# Patient Record
Sex: Male | Born: 1955 | Race: White | Hispanic: No | Smoking: Current every day smoker
Health system: Southern US, Community
[De-identification: ages and names within clinical notes are randomized; demographics above are authoritative.]

## PROBLEM LIST (undated history)

## (undated) DIAGNOSIS — R0600 Dyspnea, unspecified: Secondary | ICD-10-CM

## (undated) DIAGNOSIS — I10 Essential (primary) hypertension: Secondary | ICD-10-CM

## (undated) DIAGNOSIS — M1990 Unspecified osteoarthritis, unspecified site: Secondary | ICD-10-CM

## (undated) DIAGNOSIS — N289 Disorder of kidney and ureter, unspecified: Secondary | ICD-10-CM

## (undated) DIAGNOSIS — G459 Transient cerebral ischemic attack, unspecified: Secondary | ICD-10-CM

## (undated) DIAGNOSIS — I4891 Unspecified atrial fibrillation: Secondary | ICD-10-CM

## (undated) DIAGNOSIS — K8689 Other specified diseases of pancreas: Secondary | ICD-10-CM

## (undated) DIAGNOSIS — I639 Cerebral infarction, unspecified: Secondary | ICD-10-CM

## (undated) DIAGNOSIS — I2699 Other pulmonary embolism without acute cor pulmonale: Secondary | ICD-10-CM

## (undated) DIAGNOSIS — I499 Cardiac arrhythmia, unspecified: Secondary | ICD-10-CM

## (undated) DIAGNOSIS — I82409 Acute embolism and thrombosis of unspecified deep veins of unspecified lower extremity: Secondary | ICD-10-CM

## (undated) DIAGNOSIS — Z87442 Personal history of urinary calculi: Secondary | ICD-10-CM

## (undated) DIAGNOSIS — Z720 Tobacco use: Secondary | ICD-10-CM

## (undated) DIAGNOSIS — K219 Gastro-esophageal reflux disease without esophagitis: Secondary | ICD-10-CM

## (undated) HISTORY — PX: VARICOSE VEIN SURGERY: SHX832

## (undated) HISTORY — PX: URETERAL STENT PLACEMENT: SHX822

## (undated) HISTORY — PX: CATARACT EXTRACTION, BILATERAL: SHX1313

---

## 1998-03-05 ENCOUNTER — Inpatient Hospital Stay (HOSPITAL_COMMUNITY): Admission: AD | Admit: 1998-03-05 | Discharge: 1998-03-08 | Payer: Self-pay | Admitting: Cardiology

## 2018-03-22 ENCOUNTER — Emergency Department (HOSPITAL_COMMUNITY): Payer: Medicare PPO

## 2018-03-22 ENCOUNTER — Other Ambulatory Visit: Payer: Self-pay

## 2018-03-22 ENCOUNTER — Emergency Department (HOSPITAL_COMMUNITY)
Admission: EM | Admit: 2018-03-22 | Discharge: 2018-03-22 | Disposition: A | Discharge status: Home / Self Care | Payer: Medicare PPO | Attending: Emergency Medicine | Admitting: Emergency Medicine

## 2018-03-22 DIAGNOSIS — K869 Disease of pancreas, unspecified: Secondary | ICD-10-CM | POA: Diagnosis not present

## 2018-03-22 DIAGNOSIS — N200 Calculus of kidney: Secondary | ICD-10-CM | POA: Diagnosis not present

## 2018-03-22 DIAGNOSIS — R109 Unspecified abdominal pain: Secondary | ICD-10-CM | POA: Diagnosis present

## 2018-03-22 LAB — BASIC METABOLIC PANEL
ANION GAP: 11 (ref 5–15)
BUN: 13 mg/dL (ref 6–20)
CALCIUM: 9.3 mg/dL (ref 8.9–10.3)
CO2: 21 mmol/L — AB (ref 22–32)
Chloride: 109 mmol/L (ref 101–111)
Creatinine, Ser: 0.92 mg/dL (ref 0.61–1.24)
GLUCOSE: 105 mg/dL — AB (ref 65–99)
POTASSIUM: 3.8 mmol/L (ref 3.5–5.1)
Sodium: 141 mmol/L (ref 135–145)

## 2018-03-22 LAB — URINALYSIS, ROUTINE W REFLEX MICROSCOPIC
GLUCOSE, UA: NEGATIVE mg/dL
Ketones, ur: NEGATIVE mg/dL
Leukocytes, UA: NEGATIVE
Nitrite: NEGATIVE
PH: 5 (ref 5.0–8.0)
PROTEIN: NEGATIVE mg/dL
Specific Gravity, Urine: 1.033 — ABNORMAL HIGH (ref 1.005–1.030)

## 2018-03-22 LAB — CBC WITH DIFFERENTIAL/PLATELET
BASOS ABS: 0 10*3/uL (ref 0.0–0.1)
BASOS PCT: 1 %
Eosinophils Absolute: 0.2 10*3/uL (ref 0.0–0.7)
Eosinophils Relative: 2 %
HEMATOCRIT: 45.8 % (ref 39.0–52.0)
HEMOGLOBIN: 15 g/dL (ref 13.0–17.0)
LYMPHS PCT: 37 %
Lymphs Abs: 3 10*3/uL (ref 0.7–4.0)
MCH: 30 pg (ref 26.0–34.0)
MCHC: 32.8 g/dL (ref 30.0–36.0)
MCV: 91.6 fL (ref 78.0–100.0)
MONO ABS: 0.8 10*3/uL (ref 0.1–1.0)
Monocytes Relative: 10 %
NEUTROS ABS: 3.9 10*3/uL (ref 1.7–7.7)
NEUTROS PCT: 50 %
PLATELETS: 173 10*3/uL (ref 150–400)
RBC: 5 MIL/uL (ref 4.22–5.81)
RDW: 13.6 % (ref 11.5–15.5)
WBC: 7.9 10*3/uL (ref 4.0–10.5)

## 2018-03-22 LAB — PROTIME-INR
INR: 1.62
Prothrombin Time: 19.1 seconds — ABNORMAL HIGH (ref 11.4–15.2)

## 2018-03-22 MED ORDER — ONDANSETRON HCL 4 MG/2ML IJ SOLN
4.0000 mg | Freq: Once | INTRAMUSCULAR | Status: AC
  Administered 2018-03-22: 4 mg via INTRAVENOUS
  Filled 2018-03-22: qty 2

## 2018-03-22 MED ORDER — HYDROMORPHONE HCL 1 MG/ML IJ SOLN
1.0000 mg | Freq: Once | INTRAMUSCULAR | Status: AC
  Administered 2018-03-22: 1 mg via INTRAVENOUS
  Filled 2018-03-22: qty 1

## 2018-03-22 MED ORDER — IBUPROFEN 800 MG PO TABS: 800.0000 mg | ORAL_TABLET | Freq: Three times a day (TID) | ORAL | 0 refills | Status: DC

## 2018-03-22 MED ORDER — TAMSULOSIN HCL 0.4 MG PO CAPS: 0.4000 mg | ORAL_CAPSULE | Freq: Every day | ORAL | 0 refills | Status: DC

## 2018-03-22 MED ORDER — SODIUM CHLORIDE 0.9 % IV BOLUS
1000.0000 mL | Freq: Once | INTRAVENOUS | Status: AC
  Administered 2018-03-22: 1000 mL via INTRAVENOUS

## 2018-03-22 MED ORDER — OXYCODONE-ACETAMINOPHEN 5-325 MG PO TABS: 1.0000 | ORAL_TABLET | ORAL | 0 refills | Status: DC | PRN

## 2018-03-22 MED ORDER — ONDANSETRON 4 MG PO TBDP: 4.0000 mg | ORAL_TABLET | Freq: Three times a day (TID) | ORAL | 0 refills | Status: DC | PRN

## 2018-03-22 MED ORDER — KETOROLAC TROMETHAMINE 30 MG/ML IJ SOLN
30.0000 mg | Freq: Once | INTRAMUSCULAR | Status: AC
  Administered 2018-03-22: 30 mg via INTRAVENOUS
  Filled 2018-03-22: qty 1

## 2018-03-22 NOTE — Discharge Instructions (Addendum)
Take the pain medication as prescribed and follow-up with the urologist.  Return to the ED if you develop worsening pain, vomiting, fever, or any other concerns.  You should see your primary physician regarding the lesion on your pancreas.

## 2018-03-22 NOTE — ED Triage Notes (Signed)
Pt complains of right sided flank pain that started yesterday around 2am. Pt reports hematuria and nausea. Pt states passing 2-3 stones in urine, has hx of stones.

## 2018-03-22 NOTE — ED Provider Notes (Signed)
[facility] EMERGENCY DEPARTMENT Provider Note   CSN: [id number] Arrival date & time: 03/22/18  4098     History   Chief Complaint Chief Complaint  Patient presents with  . Flank Pain    HPI Roger Phelps is a 62 y.o. male.  Obese male with history of kidney stones presenting with right-sided flank and abdominal pain that woke him from sleep around 2 AM yesterday.  He states he had some hematuria and nausea and believes he passed some kidney stones yesterday morning.  He did start to feel better the pain returned again tonight.  He reports pain to his right abdomen right side and right low back similar to previous kidney stones.  He denies any fever or vomiting.  He has had some nausea.  He has some pain with urination and hematuria.  Denies any testicular pain.  Reports he is required a catheter in the past as well as had lithotripsy for kidney stones.  He does not take anything at home for pain. Patient on coumadin for history of PE/DVT.  The history is provided by the patient.  Flank Pain  Pertinent negatives include no chest pain, no abdominal pain, no headaches and no shortness of breath.    No past medical history on file.  There are no active problems to display for this patient.   * The histories are not reviewed yet. Please review them in the "History" navigator section and refresh this SmartLink.      Home Medications    Prior to Admission medications   Not on File    Family History No family history on file.  Social History Social History   Tobacco Use  . Smoking status: Not on file  Substance Use Topics  . Alcohol use: Not on file  . Drug use: Not on file     Allergies   Patient has no allergy information on record.   Review of Systems Review of Systems  Constitutional: Negative for activity change, appetite change and fever.  HENT: Negative for congestion and rhinorrhea.   Eyes: Negative for visual disturbance.  Respiratory: Negative for  cough, chest tightness and shortness of breath.   Cardiovascular: Negative for chest pain.  Gastrointestinal: Positive for nausea. Negative for abdominal pain and vomiting.  Genitourinary: Positive for decreased urine volume, difficulty urinating, dysuria, flank pain, hematuria and urgency. Negative for penile swelling, scrotal swelling and testicular pain.  Musculoskeletal: Positive for back pain. Negative for arthralgias and myalgias.  Skin: Negative for rash.  Neurological: Negative for dizziness, weakness and headaches.    all other systems are negative except as noted in the HPI and PMH.    Physical Exam Updated Vital Signs BP (!) 157/78 (BP Location: Left Arm)   Pulse 93   Temp 98 F (36.7 C) (Oral)   Resp (!) 22   Ht  (2.007 m)   Wt (!) 172.4 kg (380 lb)   SpO2 98%   BMI 42.81 kg/m   Physical Exam  Constitutional: He is oriented to person, place, and time. He appears well-developed and well-nourished. No distress.  HENT:  Head: Normocephalic and atraumatic.  Mouth/Throat: Oropharynx is clear and moist. No oropharyngeal exudate.  Eyes: Pupils are equal, round, and reactive to light. Conjunctivae and EOM are normal.  Neck: Normal range of motion. Neck supple.  No meningismus.  Cardiovascular: Normal rate, regular rhythm, normal heart sounds and intact distal pulses.  No murmur heard. Pulmonary/Chest: Effort normal and breath sounds normal. No  respiratory distress.  Abdominal: Soft. There is tenderness. There is no rebound and no guarding.  Obese, right-sided abdominal tenderness without guarding or rebound  Genitourinary:  Genitourinary Comments: No testicular tenderness  Musculoskeletal: Normal range of motion. He exhibits tenderness. He exhibits no edema.  Right CVA tenderness  Neurological: He is alert and oriented to person, place, and time. No cranial nerve deficit. He exhibits normal muscle tone. Coordination normal.  No ataxia on finger to nose bilaterally.  No pronator drift. 5/5 strength throughout. CN 2-12 intact.Equal grip strength. Sensation intact.   Skin: Skin is warm.  Psychiatric: He has a normal mood and affect. His behavior is normal.  Nursing note and vitals reviewed.    ED Treatments / Results  Labs (all labs ordered are listed, but only abnormal results are displayed) Labs Reviewed  URINALYSIS, ROUTINE W REFLEX MICROSCOPIC - Abnormal; Notable for the following components:      Result Value   Color, Urine AMBER (*)    APPearance CLOUDY (*)    Specific Gravity, Urine 1.033 (*)    Hgb urine dipstick SMALL (*)    Bilirubin Urine SMALL (*)    Bacteria, UA RARE (*)    All other components within normal limits  BASIC METABOLIC PANEL - Abnormal; Notable for the following components:   CO2 21 (*)    Glucose, Bld 105 (*)    All other components within normal limits  PROTIME-INR - Abnormal; Notable for the following components:   Prothrombin Time 19.1 (*)    All other components within normal limits  CBC WITH DIFFERENTIAL/PLATELET    EKG None  Radiology Ct Renal Stone Study  Result Date: 03/22/2018 CLINICAL DATA:  62 year old male with right flank pain. Hematuria and nausea. EXAM: CT ABDOMEN AND PELVIS WITHOUT CONTRAST TECHNIQUE: Multidetector CT imaging of the abdomen and pelvis was performed following the standard protocol without IV contrast. COMPARISON:  None. FINDINGS: Evaluation of this exam is limited in the absence of intravenous contrast. Lower chest: There is a partially calcified 6 mm nodule at the right lung base, most likely a partially calcified granuloma. The visualized lung bases are otherwise clear. No intra-abdominal free air or free fluid. Hepatobiliary: No focal liver abnormality is seen. No gallstones, gallbladder wall thickening, or biliary dilatation. Pancreas: The pancreas is unremarkable. There is a 4 x 4 cm fatty lesion in the uncinate process of the pancreas which may represent a lipoma. MRI may provide  better characterisation. There is no dilatation of the main pancreatic duct or gland atrophy. Spleen: Normal in size without focal abnormality. Adrenals/Urinary Tract: The adrenal glands are unremarkable. There is a 5 mm stone at the right ureteropelvic junction with mild right hydronephrosis. Mildly lobulated appearance of the left renal cortex. There is no hydronephrosis or nephrolithiasis on the left. The urinary bladder is only partially distended and appears unremarkable. Stomach/Bowel: There is no bowel obstruction or active inflammation. The appendix is not visualized with certainty. No inflammatory changes identified in the right lower quadrant. Vascular/Lymphatic: Mild atherosclerotic calcification of the distal aorta and iliac vessels. The abdominal aorta and IVC are otherwise grossly unremarkable on this noncontrast CT. No portal venous gas. There is no adenopathy. Reproductive: The prostate and seminal vesicles are grossly unremarkable. Other: None Musculoskeletal: Degenerative changes of the lower lumbar spine. No acute osseous pathology. IMPRESSION: 1. A 5 mm right UPJ stone with mild right hydronephrosis. 2. 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 Nonemergent MRI may  provide better characterisation. Electronically Signed   By: Elgie Collard M.D.   On: 03/22/2018 02:24    Procedures Procedures (including critical care time)  Medications Ordered in ED Medications  sodium chloride 0.9 % bolus 1,000 mL (1,000 mLs Intravenous New Bag/Given 03/22/18 0131)  ondansetron (ZOFRAN) injection 4 mg (has no administration in time range)  HYDROmorphone (DILAUDID) injection 1 mg (1 mg Intravenous Given 03/22/18 0124)  ketorolac (TORADOL) 30 MG/ML injection 30 mg (30 mg Intravenous Given 03/22/18 0124)     Initial Impression / Assessment and Plan / ED Course  I have reviewed the triage vital signs and the nursing notes.  Pertinent labs & imaging results that were available  during my care of the patient were reviewed by me and considered in my medical decision making (see chart for details).    Patient with history of kidney stones presenting with right-sided abdominal and flank pain with difficulty with urination.  Bladder scan 91 cc.  Urinalysis shows hematuria without infection.  Patient is hemodynamic is stable and afebrile.  CT scan confirms a 5 mm right UPJ stone.  Patient informed of pancreas lipoma and need for follow-up. Also informed of subtherapeutic INR.  Pain and nausea controlled in the ED.  Follow-up with urology.  No recent narcotic prescriptions in the database.  Return precautions discussed including fever, vomiting or worsening pain.   Final Clinical Impressions(s) / ED Diagnoses   Final diagnoses:  Kidney stone  Lesion of pancreas    ED Discharge Orders    None       Ailyne Pawley, Jeannett Senior, MD 03/22/18 [phone]

## 2018-07-27 DIAGNOSIS — R0789 Other chest pain: Secondary | ICD-10-CM | POA: Diagnosis not present

## 2018-07-27 DIAGNOSIS — K869 Disease of pancreas, unspecified: Secondary | ICD-10-CM | POA: Insufficient documentation

## 2018-07-27 DIAGNOSIS — I482 Chronic atrial fibrillation: Secondary | ICD-10-CM | POA: Insufficient documentation

## 2018-07-27 DIAGNOSIS — G8194 Hemiplegia, unspecified affecting left nondominant side: Secondary | ICD-10-CM | POA: Diagnosis not present

## 2018-07-27 DIAGNOSIS — F17200 Nicotine dependence, unspecified, uncomplicated: Secondary | ICD-10-CM | POA: Diagnosis not present

## 2018-07-27 DIAGNOSIS — Z86718 Personal history of other venous thrombosis and embolism: Secondary | ICD-10-CM | POA: Insufficient documentation

## 2018-07-27 DIAGNOSIS — Z801 Family history of malignant neoplasm of trachea, bronchus and lung: Secondary | ICD-10-CM | POA: Diagnosis not present

## 2018-07-27 DIAGNOSIS — R51 Headache: Secondary | ICD-10-CM | POA: Diagnosis not present

## 2018-07-27 DIAGNOSIS — Z7982 Long term (current) use of aspirin: Secondary | ICD-10-CM | POA: Insufficient documentation

## 2018-07-27 DIAGNOSIS — Z79899 Other long term (current) drug therapy: Secondary | ICD-10-CM | POA: Diagnosis not present

## 2018-07-27 DIAGNOSIS — Z7901 Long term (current) use of anticoagulants: Secondary | ICD-10-CM | POA: Insufficient documentation

## 2018-07-27 DIAGNOSIS — Z87442 Personal history of urinary calculi: Secondary | ICD-10-CM | POA: Diagnosis not present

## 2018-07-27 DIAGNOSIS — Z9841 Cataract extraction status, right eye: Secondary | ICD-10-CM | POA: Insufficient documentation

## 2018-07-27 DIAGNOSIS — Z86711 Personal history of pulmonary embolism: Secondary | ICD-10-CM | POA: Diagnosis not present

## 2018-07-27 DIAGNOSIS — Z885 Allergy status to narcotic agent status: Secondary | ICD-10-CM | POA: Diagnosis not present

## 2018-07-27 DIAGNOSIS — R29818 Other symptoms and signs involving the nervous system: Secondary | ICD-10-CM | POA: Diagnosis present

## 2018-07-27 DIAGNOSIS — Z83511 Family history of glaucoma: Secondary | ICD-10-CM | POA: Diagnosis not present

## 2018-07-27 DIAGNOSIS — I340 Nonrheumatic mitral (valve) insufficiency: Secondary | ICD-10-CM | POA: Diagnosis not present

## 2018-07-27 DIAGNOSIS — R29810 Facial weakness: Secondary | ICD-10-CM | POA: Insufficient documentation

## 2018-07-27 DIAGNOSIS — I679 Cerebrovascular disease, unspecified: Secondary | ICD-10-CM | POA: Insufficient documentation

## 2018-07-27 DIAGNOSIS — R4781 Slurred speech: Secondary | ICD-10-CM | POA: Diagnosis not present

## 2018-07-27 DIAGNOSIS — Z9842 Cataract extraction status, left eye: Secondary | ICD-10-CM | POA: Diagnosis not present

## 2018-07-27 DIAGNOSIS — I371 Nonrheumatic pulmonary valve insufficiency: Secondary | ICD-10-CM | POA: Diagnosis not present

## 2018-07-28 ENCOUNTER — Emergency Department (HOSPITAL_COMMUNITY): Payer: Medicare PPO

## 2018-07-28 ENCOUNTER — Observation Stay (HOSPITAL_COMMUNITY): Payer: Medicare PPO

## 2018-07-28 ENCOUNTER — Observation Stay (HOSPITAL_COMMUNITY)
Admission: EM | Admit: 2018-07-28 | Discharge: 2018-07-29 | Disposition: A | Discharge status: Home / Self Care | Payer: Medicare PPO | Attending: Internal Medicine | Admitting: Internal Medicine

## 2018-07-28 ENCOUNTER — Other Ambulatory Visit: Payer: Self-pay

## 2018-07-28 ENCOUNTER — Observation Stay (HOSPITAL_BASED_OUTPATIENT_CLINIC_OR_DEPARTMENT_OTHER): Payer: Medicare PPO

## 2018-07-28 ENCOUNTER — Encounter (HOSPITAL_COMMUNITY): Payer: Self-pay | Admitting: *Deleted

## 2018-07-28 DIAGNOSIS — I4891 Unspecified atrial fibrillation: Secondary | ICD-10-CM | POA: Diagnosis present

## 2018-07-28 DIAGNOSIS — Z86718 Personal history of other venous thrombosis and embolism: Secondary | ICD-10-CM

## 2018-07-28 DIAGNOSIS — R079 Chest pain, unspecified: Secondary | ICD-10-CM

## 2018-07-28 DIAGNOSIS — I340 Nonrheumatic mitral (valve) insufficiency: Secondary | ICD-10-CM | POA: Diagnosis not present

## 2018-07-28 DIAGNOSIS — R29818 Other symptoms and signs involving the nervous system: Secondary | ICD-10-CM | POA: Diagnosis present

## 2018-07-28 DIAGNOSIS — G459 Transient cerebral ischemic attack, unspecified: Secondary | ICD-10-CM

## 2018-07-28 DIAGNOSIS — Z7901 Long term (current) use of anticoagulants: Secondary | ICD-10-CM

## 2018-07-28 DIAGNOSIS — I639 Cerebral infarction, unspecified: Secondary | ICD-10-CM

## 2018-07-28 DIAGNOSIS — Z86711 Personal history of pulmonary embolism: Secondary | ICD-10-CM | POA: Diagnosis present

## 2018-07-28 DIAGNOSIS — Z720 Tobacco use: Secondary | ICD-10-CM | POA: Diagnosis present

## 2018-07-28 DIAGNOSIS — I482 Chronic atrial fibrillation, unspecified: Secondary | ICD-10-CM

## 2018-07-28 DIAGNOSIS — K8689 Other specified diseases of pancreas: Secondary | ICD-10-CM | POA: Diagnosis present

## 2018-07-28 HISTORY — DX: Disorder of kidney and ureter, unspecified: N28.9

## 2018-07-28 HISTORY — DX: Other specified diseases of pancreas: K86.89

## 2018-07-28 HISTORY — DX: Tobacco use: Z72.0

## 2018-07-28 HISTORY — DX: Unspecified atrial fibrillation: I48.91

## 2018-07-28 HISTORY — DX: Acute embolism and thrombosis of unspecified deep veins of unspecified lower extremity: I82.409

## 2018-07-28 HISTORY — DX: Other pulmonary embolism without acute cor pulmonale: I26.99

## 2018-07-28 LAB — RAPID URINE DRUG SCREEN, HOSP PERFORMED
AMPHETAMINES: NOT DETECTED
BENZODIAZEPINES: NOT DETECTED
Barbiturates: NOT DETECTED
COCAINE: NOT DETECTED
Opiates: NOT DETECTED
Tetrahydrocannabinol: NOT DETECTED

## 2018-07-28 LAB — CBC
HCT: 49.1 % (ref 39.0–52.0)
HEMOGLOBIN: 16.6 g/dL (ref 13.0–17.0)
MCH: 30.8 pg (ref 26.0–34.0)
MCHC: 33.8 g/dL (ref 30.0–36.0)
MCV: 91.1 fL (ref 78.0–100.0)
Platelets: 175 10*3/uL (ref 150–400)
RBC: 5.39 MIL/uL (ref 4.22–5.81)
RDW: 13.7 % (ref 11.5–15.5)
WBC: 9.6 10*3/uL (ref 4.0–10.5)

## 2018-07-28 LAB — BASIC METABOLIC PANEL
ANION GAP: 10 (ref 5–15)
BUN: 11 mg/dL (ref 6–20)
CALCIUM: 9 mg/dL (ref 8.9–10.3)
CO2: 23 mmol/L (ref 22–32)
Chloride: 106 mmol/L (ref 98–111)
Creatinine, Ser: 0.85 mg/dL (ref 0.61–1.24)
Glucose, Bld: 117 mg/dL — ABNORMAL HIGH (ref 70–99)
POTASSIUM: 3.6 mmol/L (ref 3.5–5.1)
Sodium: 139 mmol/L (ref 135–145)

## 2018-07-28 LAB — DIFFERENTIAL
BASOS ABS: 0 10*3/uL (ref 0.0–0.1)
BASOS PCT: 0 %
Eosinophils Absolute: 0.3 10*3/uL (ref 0.0–0.7)
Eosinophils Relative: 3 %
Lymphocytes Relative: 35 %
Lymphs Abs: 3.2 10*3/uL (ref 0.7–4.0)
Monocytes Absolute: 0.8 10*3/uL (ref 0.1–1.0)
Monocytes Relative: 9 %
NEUTROS ABS: 4.8 10*3/uL (ref 1.7–7.7)
NEUTROS PCT: 53 %

## 2018-07-28 LAB — TROPONIN I

## 2018-07-28 LAB — URINALYSIS, ROUTINE W REFLEX MICROSCOPIC
Bilirubin Urine: NEGATIVE
Glucose, UA: NEGATIVE mg/dL
Hgb urine dipstick: NEGATIVE
Ketones, ur: NEGATIVE mg/dL
Leukocytes, UA: NEGATIVE
Nitrite: NEGATIVE
PH: 5 (ref 5.0–8.0)
Protein, ur: NEGATIVE mg/dL
SPECIFIC GRAVITY, URINE: 1.008 (ref 1.005–1.030)

## 2018-07-28 LAB — HEPATIC FUNCTION PANEL
ALBUMIN: 3.8 g/dL (ref 3.5–5.0)
ALK PHOS: 77 U/L (ref 38–126)
ALT: 28 U/L (ref 0–44)
AST: 28 U/L (ref 15–41)
BILIRUBIN INDIRECT: 0.6 mg/dL (ref 0.3–0.9)
Bilirubin, Direct: 0.1 mg/dL (ref 0.0–0.2)
TOTAL PROTEIN: 7.5 g/dL (ref 6.5–8.1)
Total Bilirubin: 0.7 mg/dL (ref 0.3–1.2)

## 2018-07-28 LAB — ECHOCARDIOGRAM COMPLETE
Height: 79 in
WEIGHTICAEL: 5950.66 [oz_av]

## 2018-07-28 LAB — LIPID PANEL
Cholesterol: 121 mg/dL (ref 0–200)
HDL: 22 mg/dL — AB (ref >40)
LDL Cholesterol: 83 mg/dL (ref 0–99)
TRIGLYCERIDES: 80 mg/dL (ref <150)
Total CHOL/HDL Ratio: 5.5 RATIO
VLDL: 16 mg/dL (ref 0–40)

## 2018-07-28 LAB — PROTIME-INR
INR: 1.78
INR: 1.82
PROTHROMBIN TIME: 20.5 s — AB (ref 11.4–15.2)
PROTHROMBIN TIME: 20.9 s — AB (ref 11.4–15.2)

## 2018-07-28 LAB — ETHANOL: Alcohol, Ethyl (B): <10 mg/dL (ref <10)

## 2018-07-28 LAB — I-STAT TROPONIN, ED: Troponin i, poc: 0 ng/mL (ref 0.00–0.08)

## 2018-07-28 LAB — MRSA PCR SCREENING: MRSA by PCR: NEGATIVE

## 2018-07-28 MED ORDER — NITROGLYCERIN 0.4 MG SL SUBL
0.4000 mg | SUBLINGUAL_TABLET | SUBLINGUAL | Status: DC | PRN
  Administered 2018-07-28: 0.4 mg via SUBLINGUAL
  Filled 2018-07-28: qty 1

## 2018-07-28 MED ORDER — ACETAMINOPHEN 325 MG PO TABS: 650.0000 mg | ORAL_TABLET | ORAL | Status: DC | PRN

## 2018-07-28 MED ORDER — NICOTINE 21 MG/24HR TD PT24
21.0000 mg | MEDICATED_PATCH | Freq: Every day | TRANSDERMAL | Status: DC | PRN
  Administered 2018-07-29: 21 mg via TRANSDERMAL
  Filled 2018-07-28: qty 1

## 2018-07-28 MED ORDER — ASPIRIN 81 MG PO CHEW
324.0000 mg | CHEWABLE_TABLET | Freq: Once | ORAL | Status: DC
  Filled 2018-07-28: qty 4

## 2018-07-28 MED ORDER — STROKE: EARLY STAGES OF RECOVERY BOOK
Freq: Once | Status: AC
  Administered 2018-07-28: 11:00:00
  Filled 2018-07-28 (×2): qty 1

## 2018-07-28 MED ORDER — ACETAMINOPHEN 160 MG/5ML PO SOLN: 650.0000 mg | ORAL | Status: DC | PRN

## 2018-07-28 MED ORDER — WARFARIN SODIUM 2.5 MG PO TABS
2.5000 mg | ORAL_TABLET | Freq: Once | ORAL | Status: AC
  Administered 2018-07-28: 2.5 mg via ORAL
  Filled 2018-07-28: qty 1

## 2018-07-28 MED ORDER — ONDANSETRON HCL 4 MG PO TABS: 4.0000 mg | ORAL_TABLET | Freq: Four times a day (QID) | ORAL | Status: DC | PRN

## 2018-07-28 MED ORDER — WARFARIN SODIUM 5 MG PO TABS
10.0000 mg | ORAL_TABLET | Freq: Once | ORAL | Status: AC
  Administered 2018-07-28: 10 mg via ORAL
  Filled 2018-07-28: qty 2

## 2018-07-28 MED ORDER — ACETAMINOPHEN 650 MG RE SUPP: 650.0000 mg | RECTAL | Status: DC | PRN

## 2018-07-28 MED ORDER — ONDANSETRON HCL 4 MG/2ML IJ SOLN: 4.0000 mg | Freq: Four times a day (QID) | INTRAMUSCULAR | Status: DC | PRN

## 2018-07-28 MED ORDER — WARFARIN - PHARMACIST DOSING INPATIENT
Freq: Every day | Status: DC
  Administered 2018-07-28 – 2018-07-29 (×2)

## 2018-07-28 NOTE — ED Provider Notes (Signed)
[facility] EMERGENCY DEPARTMENT Provider Note   CSN: [id number] Arrival date & time: 07/27/18  2359     History   Chief Complaint Chief Complaint  Patient presents with  . Chest Pain    HPI Roger Phelps is a 62 y.o. male.  The history is provided by the patient.  He has history of atrial fibrillation, DVT, pulmonary embolism and is anticoagulated on warfarin, comes in because of chest pain since yesterday afternoon.  He describes a heavy feeling in his chest which he rates at 8/10.  Nothing makes it better, nothing makes it worse.  There is associated dyspnea, but no nausea or diaphoresis.  He is also complaining of some pain in his left upper arm which she also rates at 8/10.  Nothing makes the pain better, nothing makes it worse.  He has not done anything to treat these symptoms.  He has also noted that his speech has been slurred with the last known time when he was not slurring his speech 2 days ago.  He denies any weakness or numbness.  Of note, last INR was about 1 week ago and he thinks it was 2.8.  Past Medical History:  Diagnosis Date  . Atrial fibrillation (HCC)   . DVT (deep venous thrombosis) (HCC)   . Pancreatic mass   . Pulmonary embolism (HCC)   . Renal disorder    kidney stones    There are no active problems to display for this patient.   Past Surgical History:  Procedure Laterality Date  . CATARACT EXTRACTION, BILATERAL    . URETERAL STENT PLACEMENT Bilateral   . VARICOSE VEIN SURGERY Left         Home Medications    Prior to Admission medications   Medication Sig Start Date End Date Taking? Authorizing Provider  ibuprofen (ADVIL,MOTRIN) 800 MG tablet Take 1 tablet (800 mg total) by mouth 3 (three) times daily. 03/22/18   Rancour, Jeannett Senior, MD  ondansetron (ZOFRAN ODT) 4 MG disintegrating tablet Take 1 tablet (4 mg total) by mouth every 8 (eight) hours as needed for nausea or vomiting. 03/22/18   Rancour, Jeannett Senior, MD  oxyCODONE-acetaminophen  (PERCOCET/ROXICET) 5-325 MG tablet Take 1 tablet by mouth every 4 (four) hours as needed for severe pain. 03/22/18   Rancour, Jeannett Senior, MD  tamsulosin (FLOMAX) 0.4 MG CAPS capsule Take 1 capsule (0.4 mg total) by mouth daily after supper. 03/22/18   Glynn Octave, MD    Family History History reviewed. No pertinent family history.  Social History Social History   Tobacco Use  . Smoking status: Current Some Day Smoker  . Smokeless tobacco: Never Used  Substance Use Topics  . Alcohol use: Yes    Comment: ocassionally  . Drug use: Never     Allergies   Codeine   Review of Systems Review of Systems  All other systems reviewed and are negative.    Physical Exam Updated Vital Signs BP 109/75 (BP Location: Left Arm)   Pulse 89   Temp 98 F (36.7 C) (Oral)   Resp 17   Ht 6\' 7"  (2.007 m)   Wt (!) 170.1 kg   SpO2 94%   BMI 42.25 kg/m   Physical Exam  Nursing note and vitals reviewed.  62 year old male, resting comfortably and in no acute distress. Vital signs are normal. Oxygen saturation is 94%, which is normal. Head is normocephalic and atraumatic. PERRLA, EOMI. Oropharynx is clear. Neck is nontender and supple without adenopathy or JVD.  There are no carotid bruits. Back is nontender and there is no CVA tenderness. Lungs are clear without rales, wheezes, or rhonchi. Chest is nontender. Heart has an irregular rhythm without murmur. Abdomen is soft, flat, nontender without masses or hepatosplenomegaly and peristalsis is normoactive. Extremities have 2+ edema, full range of motion is present. Skin is warm and dry without rash. Neurologic: Awake and alert and oriented.  Speech appears normal to me.  Mild right central facial droop.  Tongue protrudes in the midline.  Moderate to severe left sided weakness with left arm strength 3/5 and left leg strength 4/5.  There is some spasticity in the left arm.  No Babinski response noted.  Strength on the right side is 5/5.  ED  Treatments / Results  Labs (all labs ordered are listed, but only abnormal results are displayed) Labs Reviewed  BASIC METABOLIC PANEL - Abnormal; Notable for the following components:      Result Value   Glucose, Bld 117 (*)    All other components within normal limits  PROTIME-INR - Abnormal; Notable for the following components:   Prothrombin Time 20.9 (*)    All other components within normal limits  CBC  DIFFERENTIAL  ETHANOL  RAPID URINE DRUG SCREEN, HOSP PERFORMED  URINALYSIS, ROUTINE W REFLEX MICROSCOPIC  HEPATIC FUNCTION PANEL  I-STAT TROPONIN, ED    EKG EKG Interpretation  Date/Time:  Monday July 28 2018 00:05:26 EDT Ventricular Rate:  83 PR Interval:    QRS Duration: 93 QT Interval:  370 QTC Calculation: 435 R Axis:   61 Text Interpretation:  Atrial fibrillation Minimal ST depression, diffuse leads No old tracing to compare Confirmed by Dione Booze ([id number]) on 07/28/2018 12:09:02 AM   Radiology Dg Chest 2 View  Result Date: 07/28/2018 CLINICAL DATA:  Chest pain onset today while sitting in a chair, headache and slurred speech since 1400 hours on Saturday EXAM: CHEST - 2 VIEW COMPARISON:  None FINDINGS: Normal heart size, mediastinal contours, and pulmonary vascularity. Lungs clear. No acute infiltrate, pleural effusion or pneumothorax. Bones appear demineralized. IMPRESSION: No acute abnormalities. Electronically Signed   By: Ulyses Southward M.D.   On: 07/28/2018 01:39   Ct Head Wo Contrast  Result Date: 07/28/2018 CLINICAL DATA:  Headache with slurred speech for 36 hours. EXAM: CT HEAD WITHOUT CONTRAST TECHNIQUE: Contiguous axial images were obtained from the base of the skull through the vertex without intravenous contrast. COMPARISON:  None. FINDINGS: Brain: No intracranial hemorrhage, mass effect, or midline shift. No hydrocephalus. The basilar cisterns are patent. No evidence of territorial infarct or acute ischemia. No extra-axial or intracranial fluid  collection. Vascular: No hyperdense vessel. Skull: No fracture or focal lesion. Sinuses/Orbits: No acute findings.  Bilateral cataract resection. Other: None. IMPRESSION: No acute intracranial abnormality. Electronically Signed   By: Narda Rutherford M.D.   On: 07/28/2018 01:52    Procedures Procedures  CRITICAL CARE Performed by: Dione Booze Total critical care time: 40 minutes Critical care time was exclusive of separately billable procedures and treating other patients. Critical care was necessary to treat or prevent imminent or life-threatening deterioration. Critical care was time spent personally by me on the following activities: development of treatment plan with patient and/or surrogate as well as nursing, discussions with consultants, evaluation of patient's response to treatment, examination of patient, obtaining history from patient or surrogate, ordering and performing treatments and interventions, ordering and review of laboratory studies, ordering and review of radiographic studies, pulse oximetry and re-evaluation of patient's condition.  Medications Ordered in ED Medications  aspirin chewable tablet 324 mg (324 mg Oral Not Given 07/28/18 0245)  nitroGLYCERIN (NITROSTAT) SL tablet 0.4 mg (0.4 mg Sublingual Given 07/28/18 0221)     Initial Impression / Assessment and Plan / ED Course  I have reviewed the triage vital signs and the nursing notes.  Pertinent labs & imaging results that were available during my care of the patient were reviewed by me and considered in my medical decision making (see chart for details).  Chest pain of uncertain cause.  Constant nature of pain would be atypical for ACS.  ECG shows minimal ST segment changes that do not appear particularly ischemic.  Initial troponin is normal and, given length of time symptoms have been present, makes ACS very unlikely. Left-sided weakness appears to be a subacute stroke.  Right-sided facial weakness with left-sided  extremity weakness is suggestive of a posterior circulation stroke.  Anticoagulated state.  Aspirin had been administered by EMS.  He is well outside the window for thrombolytic treatment or endovascular treatment, so code stroke is not activated.  He is sent for CT of the head, will need to check INR.  Old records are reviewed, and he has no relevant past visits.  CT of head is negative for acute process.  He continues to have chest discomfort, and will be given nitroglycerin.  INR is slightly subtherapeutic.  CBC and metabolic panel are unremarkable.  Chest x-ray is unremarkable.  He will need to have MRI done in the morning.  Case is discussed with Dr. Robb Matar, of [facility] hospitalist who agrees to admit the patient.  CHA2DS2/VAS Stroke Risk score = 2, based on stroke today.   Final Clinical Impressions(s) / ED Diagnoses   Final diagnoses:  Cerebrovascular accident (CVA), unspecified mechanism (HCC)  Nonspecific chest pain  Chronic atrial fibrillation (HCC)  Anticoagulated on warfarin    ED Discharge Orders    None       Dione Booze, MD 07/28/18 [phone]

## 2018-07-28 NOTE — Progress Notes (Signed)
ANTICOAGULATION CONSULT NOTE -   Pharmacy Consult for Warfarin  Indication: Atrial fibrillation  Allergies  Allergen Reactions  . Codeine Nausea Only    Patient Measurements: Height: 6\' 7"  (200.7 cm) Weight: (!) 371 lb 14.7 oz (168.7 kg) IBW/kg (Calculated) : 93.7 HEPARIN DW (KG): 132.6   Vital Signs: Temp: 97.4 F (36.3 C) (09/09 1140) Temp Source: Oral (09/09 1140) BP: 126/89 (09/09 0900) Pulse Rate: 69 (09/09 0900)  Labs: Recent Labs    07/28/18 0015 07/28/18 0537 07/28/18 1235 07/28/18 1353  HGB 16.6  --   --   --   HCT 49.1  --   --   --   PLT 175  --   --   --   LABPROT 20.9*  --   --  20.5*  INR 1.82  --   --  1.78  CREATININE 0.85  --   --   --   TROPONINI  --  <0.03 <0.03  --    Estimated Creatinine Clearance: 161.7 mL/min (by C-G formula based on SCr of 0.85 mg/dL).  Medical History: Past Medical History:  Diagnosis Date  . Atrial fibrillation (HCC)   . DVT (deep venous thrombosis) (HCC)   . Pancreatic mass   . Pulmonary embolism (HCC)   . Renal disorder    kidney stones  . Tobacco use     Medications:  Warfarin 10 mg PO daily. Last dose per patient at 1700 07/27/18  Assessment: 62 yo male with hx of chronic Afib, PE, and DVT seen in the ED for chest tightness radiating to his left arm.Pt also reports frontal headache x 24 hrs with slurred speech and observed right facial droop and left-sided extremity weakness. Pharmacy has been consulted for warfarin dosing.  Patient reports his last INR was 2.8 within the last week on his usual 10 mg daily dose. Currently is INR is slightly subtherapeutic at 1.78  Goal of Therapy:  Therapeutic INR: 2-3  Plan:  Warfarin 10 mg PO x 1 dose Daily PT/INR    Judeth Cornfield, PharmD Clinical Pharmacist 07/28/2018 2:38 PM

## 2018-07-28 NOTE — Plan of Care (Signed)
  Problem: Acute Rehab PT Goals(only PT should resolve) Goal: Patient Will Transfer Sit To/From Stand Outcome: Progressing Flowsheets (Taken 07/28/2018 1220) Patient will transfer sit to/from stand: with modified independence Goal: Pt Will Transfer Bed To Chair/Chair To Bed Outcome: Progressing Flowsheets (Taken 07/28/2018 1220) Pt will Transfer Bed to Chair/Chair to Bed: with modified independence Goal: Pt Will Ambulate Outcome: Progressing Flowsheets (Taken 07/28/2018 1220) Pt will Ambulate: with modified independence; 75 feet; with cane   12:21 PM, 07/28/18 Ocie Bob, MPT Physical Therapist with [facility] [phone] office [phone] mobile phone

## 2018-07-28 NOTE — Progress Notes (Signed)
ANTICOAGULATION CONSULT NOTE - Preliminary  Pharmacy Consult for Warfarin  Indication: Atrial fibrillation  Allergies  Allergen Reactions  . Codeine Nausea Only    Patient Measurements: Height: 6\' 7"  (200.7 cm) Weight: (!) 375 lb (170.1 kg) IBW/kg (Calculated) : 93.7 HEPARIN DW (KG): 133   Vital Signs: Temp: 98 F (36.7 C) (09/09 0048) Temp Source: Oral (09/09 0009) BP: 122/91 (09/09 0330) Pulse Rate: 94 (09/09 0330)  Labs: Recent Labs    07/28/18 0015  HGB 16.6  HCT 49.1  PLT 175  LABPROT 20.9*  INR 1.82  CREATININE 0.85   Estimated Creatinine Clearance: 162.5 mL/min (by C-G formula based on SCr of 0.85 mg/dL).  Medical History: Past Medical History:  Diagnosis Date  . Atrial fibrillation (HCC)   . DVT (deep venous thrombosis) (HCC)   . Pancreatic mass   . Pulmonary embolism (HCC)   . Renal disorder    kidney stones  . Tobacco use     Medications:  Warfarin 10 mg PO daily. Last dose per patient at 1700 07/27/18  Assessment: 62 yo male with hx of chronic Afib, PE, and DVT seen in the ED for chest tightness radiating to his left arm.Pt also reports frontal headache x 24 hrs with slurred speech and observed right facial droop and left-sided extremity weakness. Pharmacy has been consulted for warfarin dosing.  Patient reports his last INR was 2.8 within the last week on his usual 10 mg daily dose. Currently is INR is slightly subtherapeutic at 1.82.  Goal of Therapy:  Therapeutic INR: 2-3  Plan:  Warfarin 2.5 mg PO x 1 dose Daily PT/INR  Preliminary review of pertinent patient information completed.  Jeani Hawking clinical pharmacist will complete review during morning rounds to assess the patient and finalize treatment regimen.  Arelia Sneddon, [facility] 07/28/2018,4:57 AM

## 2018-07-28 NOTE — Care Management Obs Status (Signed)
MEDICARE OBSERVATION STATUS NOTIFICATION   Patient Details  Name: Roger Phelps MRN: [id number] Date of Birth: [birth year]   Medicare Observation Status Notification Given:  Yes    Dequavion Follette, Chrystine Oiler, RN 07/28/2018, 4:16 PM

## 2018-07-28 NOTE — Evaluation (Signed)
Physical Therapy Evaluation Patient Details Name: Roger Phelps MRN: [id number] DOB: [birth year] Today's Date: 07/28/2018   History of Present Illness  Roger Phelps is a 62 y.o. male with medical history significant of chronic atrial fibrillation, history of PE, history of DVT, pancreatic mass, history of urolithiasis, tobacco use who is coming to the emergency department due to chest tightness that started Sunday evening while he was seeding in his chair which radiates to his left arm, associated with mild dyspnea, but denies nausea, emesis, diaphoresis or palpitations.  There are no relieving or worsening factors.  He was given aspirin and sublingual nitroglycerin in route to the hospital without any significant changes to the pain.  He also complains of having a frontal headache since Saturday evening associated with slurred speech and he has been noticed to have right facial droop and left-sided extremities weakness in the emergency department.  He was last known to be well on Saturday.  He has had some dizziness and gait disturbance while ambulating.  He denies fever, chills, sore throat, wheezing, hemoptysis, diarrhea, constipation, melena or hematochezia.  No dysuria, frequency or hematuria.  Denies polyuria, polydipsia, polyphagia or blurred vision.  Denies pruritus or skin rashes.    Clinical Impression  Patient states he normally has weakness LLE secondary to hip problems and slightly weaker in LUE, but worse than usual.  Patient demonstrates labored movement for sitting up at bedside, able to stand, transfer to chair without problem using armrest of chair, tends to lean on nearby objects for support when walking in hallway, no loss of balance, but limited due to mild SOB and fatigue.  Patient tolerated sitting up in chair after therapy.  Patient will benefit from continued physical therapy in hospital and recommended venue below to increase strength, balance, endurance for safe ADLs and gait.     Follow Up Recommendations Home health PT;Outpatient PT(HHPT or OP)    Equipment Recommendations  None recommended by PT    Recommendations for Other Services       Precautions / Restrictions Precautions Precautions: Fall Restrictions Weight Bearing Restrictions: No      Mobility  Bed Mobility Overal bed mobility: Modified Independent             General bed mobility comments: increased time with head of bed flat no use of bed rails  Transfers Overall transfer level: Needs assistance Equipment used: None;1 person hand held assist Transfers: Sit to/from Stand;Stand Pivot Transfers Sit to Stand: Supervision Stand pivot transfers: Supervision       General transfer comment: slow labored movement  Ambulation/Gait Ambulation/Gait assistance: Min guard Gait Distance (Feet): 35 Feet Assistive device: None Gait Pattern/deviations: Decreased step length - right;Decreased step length - left;Decreased stride length Gait velocity: decreased   General Gait Details: slightly labored cadence with frequent leaning on nearby objects for support, no loss of balance, limited mostly due to c/o fatigue  Stairs            Wheelchair Mobility    Modified Rankin (Stroke Patients Only)       Balance Overall balance assessment: Mild deficits observed, not formally tested                                           Pertinent Vitals/Pain Pain Assessment: No/denies pain    Home Living Family/patient expects to be discharged to:: Private residence Living Arrangements:  Alone Available Help at Discharge: Neighbor;Family Type of Home: House Home Access: Ramped entrance     Home Layout: One level Home Equipment: Cane - single point;Walker - 2 wheels;Wheelchair - manual;Shower seat;Bedside commode      Prior Function Level of Independence: Independent with assistive device(s)         Comments: community ambulator with single point cane PRN, drives      Hand Dominance   Dominant Hand: Right    Extremity/Trunk Assessment   Upper Extremity Assessment Upper Extremity Assessment: Defer to OT evaluation    Lower Extremity Assessment Lower Extremity Assessment: Generalized weakness;RLE deficits/detail;LLE deficits/detail RLE Deficits / Details: grossly 5/5 LLE Deficits / Details: grossly -4/5    Cervical / Trunk Assessment Cervical / Trunk Assessment: Normal  Communication   Communication: No difficulties  Cognition Arousal/Alertness: Awake/alert Behavior During Therapy: WFL for tasks assessed/performed Overall Cognitive Status: Within Functional Limits for tasks assessed                                        General Comments      Exercises     Assessment/Plan    PT Assessment Patient needs continued PT services  PT Problem List Decreased strength;Decreased activity tolerance;Decreased balance;Decreased mobility       PT Treatment Interventions Gait training;Stair training;Functional mobility training;Therapeutic activities;Therapeutic exercise;Patient/family education    PT Goals (Current goals can be found in the Care Plan section)  Acute Rehab PT Goals Patient Stated Goal: return home PT Goal Formulation: With patient Time For Goal Achievement: 08/01/18 Potential to Achieve Goals: Good    Frequency 7X/week   Barriers to discharge        Co-evaluation               AM-PAC PT "6 Clicks" Daily Activity  Outcome Measure Difficulty turning over in bed (including adjusting bedclothes, sheets and blankets)?: None Difficulty moving from lying on back to sitting on the side of the bed? : None Difficulty sitting down on and standing up from a chair with arms (e.g., wheelchair, bedside commode, etc,.)?: None Help needed moving to and from a bed to chair (including a wheelchair)?: A Little Help needed walking in hospital room?: A Little Help needed climbing 3-5 steps with a railing? : A  Little 6 Click Score: 21    End of Session Equipment Utilized During Treatment: Gait belt Activity Tolerance: Patient tolerated treatment well;Patient limited by fatigue Patient left: in chair;with call bell/phone within reach Nurse Communication: Mobility status;Other (comment)(RN notified that patient left up in chair) PT Visit Diagnosis: Unsteadiness on feet (R26.81);Other abnormalities of gait and mobility (R26.89);Muscle weakness (generalized) (M62.81)    Time: 5621-3086 PT Time Calculation (min) (ACUTE ONLY): 31 min   Charges:   PT Evaluation $PT Eval Moderate Complexity: 1 Mod PT Treatments $Therapeutic Activity: 23-37 mins        12:19 PM, 07/28/18 Ocie Bob, MPT Physical Therapist with [facility] 336 [phone] office [phone] mobile phone

## 2018-07-28 NOTE — Progress Notes (Signed)
Confirmed with Vella Kohler at [facility] that coumadin 2.5mg  was supposed to be given as scheduled for 5am.

## 2018-07-28 NOTE — ED Triage Notes (Signed)
Pt brought in by [facility] for c/o chest pain that started today while sitting in his chair; pt has been c/o headache with slurred speech since 2pm Saturday; pt cbg with ems 106; pt given 324 asa and one nitro SL en route by ems

## 2018-07-28 NOTE — Consult Note (Signed)
[place] A. Merlene Laughter, MD     [email or web address]          Roger Phelps is an 62 y.o. male.   ASSESSMENT/PLAN: 1.  Multiple neurological symptoms of unclear etiology: Imaging failed to reveal acute ischemic stroke which is typically expected in the situation.  Additional differential diagnosis includes complex partial seizures and complicated migraine/migraine with aura.  An EEG will be obtained.  The patient is to continue with the warfarin long-term.  Aspirin can be discontinued after 1 week.    The patient is a 62 year old white male who presents with multiple symptoms but primarily he tells me that he had a severe headache about 2 days ago while sitting watching television.  The headaches were bifrontal and came on 10/10 at that time.  The headache lasted for several hours and was not associated with other symptoms at that time.  However, the following day he reports people telling him that his speech has been slurry throughout the day.  The patient also reports having some weakness involving the left side.  His symptoms have improved since being hospitalized but he still is not back to normal.  He does not report a baseline history of episodic headaches.  There is no history of seizures.  He does not think that he had loss of consciousness or alteration of consciousness.  He reports having some chest tightness but no dyspnea.  The review of systems is otherwise unrevealing.    GENERAL: This is an obese male in no acute distress.  HEENT: Neck is supple no trauma appreciated appreciated.  ABDOMEN: soft  EXTREMITIES: There is mild ankle edema.  There is mild reddening at the ankles/distal legs.  BACK: Normal  SKIN: Normal by inspection.    MENTAL STATUS: Alert and oriented. Speech -revealed subtle dysarthria but this could be baseline, language and cognition are generally intact. Judgment and insight normal.   CRANIAL NERVES: Pupils are equal, round and  reactive to light and accomodation; extra ocular movements are full, there is no significant nystagmus; visual fields are full; upper and lower facial muscles are normal in strength and symmetric, there is no flattening of the nasolabial folds; tongue is midline; uvula is midline; shoulder elevation is normal.  MOTOR: There is mild left upper extremity drift and also mild drift of the legs bilaterally.  Direct strength testing is normal however.  Bulk and tone are normal throughout.    COORDINATION: Left finger to nose is normal, right finger to nose is normal, No rest tremor; no intention tremor; no postural tremor; no bradykinesia.  REFLEXES: Deep tendon reflexes are symmetrical and normal.   SENSATION: Normal to light touch, temperature, and pain.   NIH stroke scale 1, 1, 1, 1 total equal 4.    The brain MRI and MRA are reviewed in person.  It appears to scan are both limited due to motion artifact.  There may be mild white matter periventricular ischemic changes.  Nothing seen on DWI.  MRA is also limited but no clear large vessel occlusive disease.  Blood pressure 123/63, pulse 69, temperature 98 F (36.7 C), temperature source Oral, resp. rate 15, height '6\' 7"'$  (2.007 m), weight (!) 168.7 kg, SpO2 97 %.  Past Medical History:  Diagnosis Date  . Atrial fibrillation ([place])   . DVT (deep venous thrombosis) ([place])   . Pancreatic mass   . Pulmonary embolism ([place])   . Renal disorder    kidney stones  . Tobacco use  Past Surgical History:  Procedure Laterality Date  . CATARACT EXTRACTION, BILATERAL    . URETERAL STENT PLACEMENT Bilateral   . VARICOSE VEIN SURGERY Left     Family History  Problem Relation Age of Onset  . Glaucoma Mother   . Lung cancer Father     Social History:  reports that he has been smoking. He has never used smokeless tobacco. He reports that he drinks alcohol. He reports that he does not use drugs.  Allergies:  Allergies  Allergen Reactions  .  Codeine Nausea Only    Medications: Prior to Admission medications   Medication Sig Start Date End Date Taking? Authorizing Provider  b complex vitamins capsule Take 1 capsule by mouth daily.   Yes [provider]  Cholecalciferol (VITAMIN D-3) 5000 units TABS Take 10,000 Units by mouth daily.   Yes [provider]  Omega-3 Fatty Acids (FISH OIL) 1200 MG CAPS Take 2 capsules by mouth daily.   Yes [provider]  warfarin (COUMADIN) 10 MG tablet Take 10 mg by mouth daily.   Yes [provider]  ibuprofen (ADVIL,MOTRIN) 800 MG tablet Take 1 tablet (800 mg total) by mouth 3 (three) times daily. Patient not taking: Reported on 07/28/2018 03/22/18   Ezequiel Essex, MD  ondansetron (ZOFRAN ODT) 4 MG disintegrating tablet Take 1 tablet (4 mg total) by mouth every 8 (eight) hours as needed for nausea or vomiting. Patient not taking: Reported on 07/28/2018 03/22/18   Ezequiel Essex, MD  oxyCODONE-acetaminophen (PERCOCET/ROXICET) 5-325 MG tablet Take 1 tablet by mouth every 4 (four) hours as needed for severe pain. Patient not taking: Reported on 07/28/2018 03/22/18   Ezequiel Essex, MD  tamsulosin (FLOMAX) 0.4 MG CAPS capsule Take 1 capsule (0.4 mg total) by mouth daily after supper. Patient not taking: Reported on 07/28/2018 03/22/18   Ezequiel Essex, MD    Scheduled Meds: . aspirin  324 mg Oral Once  . warfarin  10 mg Oral Once  . Warfarin - Pharmacist Dosing Inpatient   Does not apply q1800   Continuous Infusions: PRN Meds:.acetaminophen **OR** acetaminophen (TYLENOL) oral liquid 160 mg/5 mL **OR** acetaminophen, nicotine, nitroGLYCERIN, ondansetron **OR** ondansetron (ZOFRAN) IV     Results for orders placed or performed during the hospital encounter of 07/28/18 (from the past 48 hour(s))  Basic metabolic panel     Status: Abnormal   Collection Time: 07/28/18 12:15 AM  Result Value Ref Range   Sodium 139 135 - 145 mmol/L   Potassium 3.6 3.5 - 5.1 mmol/L    Chloride 106 98 - 111 mmol/L   CO2 23 22 - 32 mmol/L   Glucose, Bld 117 (H) 70 - 99 mg/dL   BUN 11 6 - 20 mg/dL   Creatinine, Ser 0.85 0.61 - 1.24 mg/dL   Calcium 9.0 8.9 - 10.3 mg/dL   GFR calc non Af Amer >60 >60 mL/min   GFR calc Af Amer >60 >60 mL/min    Comment: (NOTE) The eGFR has been calculated using the CKD EPI equation. This calculation has not been validated in all clinical situations. eGFR's persistently <60 mL/min signify possible Chronic Kidney Disease.    Anion gap 10 5 - 15    Comment: Performed at [facility], [address], [place], [place] [address]  CBC     Status: None   Collection Time: 07/28/18 12:15 AM  Result Value Ref Range   WBC 9.6 4.0 - 10.5 K/uL   RBC 5.39 4.22 - 5.81 MIL/uL  Hemoglobin 16.6 13.0 - 17.0 g/dL   HCT 49.1 39.0 - 52.0 %   MCV 91.1 78.0 - 100.0 fL   MCH 30.8 26.0 - 34.0 pg   MCHC 33.8 30.0 - 36.0 g/dL   RDW 13.7 11.5 - 15.5 %   Platelets 175 150 - 400 K/uL    Comment: Performed at [facility], [address], [place], [place] [address]  Differential     Status: None   Collection Time: 07/28/18 12:15 AM  Result Value Ref Range   Neutrophils Relative % 53 %   Neutro Abs 4.8 1.7 - 7.7 K/uL   Lymphocytes Relative 35 %   Lymphs Abs 3.2 0.7 - 4.0 K/uL   Monocytes Relative 9 %   Monocytes Absolute 0.8 0.1 - 1.0 K/uL   Eosinophils Relative 3 %   Eosinophils Absolute 0.3 0.0 - 0.7 K/uL   Basophils Relative 0 %   Basophils Absolute 0.0 0.0 - 0.1 K/uL    Comment: Performed at [facility], [address], [place], [place] [address]  Protime-INR     Status: Abnormal   Collection Time: 07/28/18 12:15 AM  Result Value Ref Range   Prothrombin Time 20.9 (H) 11.4 - 15.2 seconds   INR 1.82     Comment: Performed at [facility], [address], [place], [place] [address]  Ethanol     Status: None   Collection Time: 07/28/18 12:15 AM  Result Value Ref Range   Alcohol, Ethyl (B) <10 <10 mg/dL    Comment: Performed at [facility], [address], [place], [place] [address]  Hepatic function panel     Status: None   Collection Time: 07/28/18 12:15 AM  Result Value Ref Range   Total Protein 7.5 6.5 - 8.1 g/dL   Albumin 3.8 3.5 - 5.0 g/dL   AST 28 15 - 41 U/L   ALT 28 0 - 44 U/L   Alkaline Phosphatase 77 38 - 126 U/L   Total Bilirubin 0.7 0.3 - 1.2 mg/dL   Bilirubin, Direct 0.1 0.0 - 0.2 mg/dL   Indirect Bilirubin 0.6 0.3 - 0.9 mg/dL    Comment: Performed at [facility], [address] [place], [place] [address]  I-stat troponin, ED     Status: None   Collection Time: 07/28/18 12:22 AM  Result Value Ref Range   Troponin i, poc 0.00 0.00 - 0.08 ng/mL   Comment 3            Comment: Due to the release kinetics of cTnI, a negative result within the first hours of the onset of symptoms does not rule out myocardial infarction with certainty. If myocardial infarction is still suspected, repeat the test at appropriate intervals.   Urinalysis, Routine w reflex microscopic     Status: None   Collection Time: 07/28/18 12:50 AM  Result Value Ref Range   Color, Urine YELLOW YELLOW   APPearance CLEAR CLEAR   Specific Gravity, Urine 1.008 1.005 - 1.030   pH 5.0 5.0 - 8.0   Glucose, UA NEGATIVE NEGATIVE mg/dL   Hgb urine dipstick NEGATIVE NEGATIVE   Bilirubin Urine NEGATIVE NEGATIVE   Ketones, ur NEGATIVE NEGATIVE mg/dL   Protein, ur NEGATIVE NEGATIVE mg/dL   Nitrite NEGATIVE NEGATIVE   Leukocytes, UA NEGATIVE NEGATIVE    Comment: Performed at [facility], [address], [place], [place] [address]  Urine rapid drug screen (hosp performed)     Status: None   Collection Time: 07/28/18 12:53 AM  Result Value Ref Range   Opiates NONE DETECTED NONE DETECTED   Cocaine NONE DETECTED NONE DETECTED   Benzodiazepines NONE DETECTED NONE DETECTED   Amphetamines NONE DETECTED NONE DETECTED   Tetrahydrocannabinol NONE DETECTED NONE DETECTED   Barbiturates NONE DETECTED NONE DETECTED    Comment: (NOTE) DRUG SCREEN FOR MEDICAL  PURPOSES ONLY.  IF CONFIRMATION IS NEEDED FOR ANY PURPOSE, NOTIFY LAB WITHIN 5 DAYS. LOWEST DETECTABLE LIMITS FOR URINE DRUG SCREEN Drug Class                     Cutoff (ng/mL) Amphetamine and metabolites    1000 Barbiturate and metabolites    200 Benzodiazepine                 557 Tricyclics and metabolites     300 Opiates and metabolites        300 Cocaine and metabolites        300 THC                            50 Performed at [facility], [address] [place], [place] [address]   MRSA PCR Screening     Status: None   Collection Time: 07/28/18  4:01 AM  Result Value Ref Range   MRSA by PCR NEGATIVE NEGATIVE    Comment:        The GeneXpert MRSA Assay (FDA approved for NASAL specimens only), is one component of a comprehensive MRSA colonization surveillance program. It is not intended to diagnose MRSA infection nor to guide or monitor treatment for MRSA infections. Performed at [facility], [address], [place], [place] [address]   Lipid panel     Status: Abnormal   Collection Time: 07/28/18  5:37 AM  Result Value Ref Range   Cholesterol 121 0 - 200 mg/dL   Triglycerides 80 <150 mg/dL   HDL 22 (L) >40 mg/dL   Total CHOL/HDL Ratio 5.5 RATIO   VLDL 16 0 - 40 mg/dL   LDL Cholesterol 83 0 - 99 mg/dL    Comment:        Total Cholesterol/HDL:CHD Risk Coronary Heart Disease Risk Table                     Men   Women  1/2 Average Risk   3.4   3.3  Average Risk       5.0   4.4  2 X Average Risk   9.6   7.1  3 X Average Risk  23.4   11.0        Use the calculated Patient Ratio above and the CHD Risk Table to determine the patient's CHD Risk.        ATP III CLASSIFICATION (LDL):  <100     mg/dL   Optimal  100-129  mg/dL   Near or Above                    Optimal  130-159  mg/dL   Borderline  160-189  mg/dL   High  >190     mg/dL   Very High Performed at [facility], [address], [place], [place] [address]   Troponin I-serum (one time only)     Status:  None   Collection Time: 07/28/18  5:37 AM  Result Value Ref Range   Troponin I <0.03 <0.03 ng/mL    Comment: Performed at [facility], [address], [place], [place] [address]  Troponin I-serum (one time only)     Status: None   Collection Time: 07/28/18 12:35 PM  Result Value Ref Range   Troponin I <0.03 <0.03 ng/mL    Comment: Performed at [facility], [address] [place], [place] [address]  Protime-INR     Status: Abnormal   Collection Time: 07/28/18  1:53 PM  Result Value Ref Range   Prothrombin Time 20.5 (H) 11.4 - 15.2 seconds   INR 1.78     Comment: Performed at [facility], [address], [place], [place] [address]    Studies/Results:   BRAIN MRI MRA FINDINGS: MRI HEAD FINDINGS  Brain: Diffusion imaging does not show any acute or subacute infarction. The brainstem and cerebellum are normal. Cerebral hemispheres appear normal without evidence of atrophy or old small or large vessel infarction. No mass lesion, hemorrhage, hydrocephalus or extra-axial collection. Patient would not allow complete imaging.  Vascular: Major vessels at the base of the brain show flow.  Skull and upper cervical spine: Negative  Sinuses/Orbits: Clear/normal  Other: None  MRA HEAD FINDINGS  Motion degraded exam. Both internal carotid arteries are patent through the skull base. The anterior and middle cerebral vessels show flow. Both vertebral arteries show flow to the basilar. There is flow in the basilar. Both posterior cerebral arteries show flow.  IMPRESSION: Motion degraded and abbreviated exam. No acute intracranial finding. No sign of old infarction.  Motion degraded intracranial MR angiography shows flow in the major vessels.      Amiir Heckard A. Merlene Laughter, M.D.  Diplomate, Tax adviser of Psychiatry and Neurology ( Neurology). 07/28/2018, 5:34 PM

## 2018-07-28 NOTE — H&P (Addendum)
History and Physical    Roger Phelps VWP:[id number] DOB: 1956-06-23 DOA: 07/28/2018  PCP: Dorice Lamas, MD   Patient coming from: Home.  I have personally briefly reviewed patient's old medical records in [facility]  Chief Complaint: CP, HA and slurred speech.  HPI: Roger Phelps is a 62 y.o. male with medical history significant of chronic atrial fibrillation, history of PE, history of DVT, pancreatic mass, history of urolithiasis, tobacco use who is coming to the emergency department due to chest tightness that started Sunday evening while he was seeding in his chair which radiates to his left arm, associated with mild dyspnea, but denies nausea, emesis, diaphoresis or palpitations.  There are no relieving or worsening factors.  He was given aspirin and sublingual nitroglycerin in route to the hospital without any significant changes to the pain.    He also complains of having a frontal headache since Saturday evening associated with slurred speech and he has been noticed to have right facial droop and left-sided extremities weakness in the emergency department.  He was last known to be well on Saturday.  He has had some dizziness and gait disturbance while ambulating.  He denies fever, chills, sore throat, wheezing, hemoptysis, diarrhea, constipation, melena or hematochezia.  No dysuria, frequency or hematuria.  Denies polyuria, polydipsia, polyphagia or blurred vision.  Denies pruritus or skin rashes.  ED Course: Initial vital signs temperature 98 F, pulse 89, respirations 17, blood pressure 109/75 mmHg and O2 sat 94% on room air.  His urinalysis and UDS were negative.  Alcohol level was less than 10 mg/dL.  CBC was normal.  PT was 20.9 seconds and INR 1.82.  BMP showed a glucose level of 117 mg/dL, but all other values were normal.  Hepatic function panel was normal as well.  Troponin was normal.  EKG shows atrial fibrillation with questionable minimal ST depression.  A 2 view  chest radiograph did not show any acute cardiopulmonary pathology.  CT head without contrast did not have any acute intracranial abnormality.  Review of Systems: As per HPI otherwise 10 point review of systems negative.   Past Medical History:  Diagnosis Date  . Atrial fibrillation (HCC)   . DVT (deep venous thrombosis) (HCC)   . Pancreatic mass   . Pulmonary embolism (HCC)   . Renal disorder    kidney stones  . Tobacco use     Past Surgical History:  Procedure Laterality Date  . CATARACT EXTRACTION, BILATERAL    . URETERAL STENT PLACEMENT Bilateral   . VARICOSE VEIN SURGERY Left      reports that he has been smoking. He has never used smokeless tobacco. He reports that he drinks alcohol. He reports that he does not use drugs.  Allergies  Allergen Reactions  . Codeine Nausea Only    Family History  Problem Relation Age of Onset  . Glaucoma Mother   . Lung cancer Father     Prior to Admission medications   Medication Sig Start Date End Date Taking? Authorizing Provider  Cholecalciferol (VITAMIN D-3) 5000 units TABS Take 10,000 Units by mouth daily.   Yes [provider]  warfarin (COUMADIN) 10 MG tablet Take 10 mg by mouth daily.   Yes [provider]  ibuprofen (ADVIL,MOTRIN) 800 MG tablet Take 1 tablet (800 mg total) by mouth 3 (three) times daily. 03/22/18   Rancour, Jeannett Senior, MD  ondansetron (ZOFRAN ODT) 4 MG disintegrating tablet Take 1 tablet (4 mg total) by mouth  every 8 (eight) hours as needed for nausea or vomiting. 03/22/18   Rancour, Jeannett Senior, MD  oxyCODONE-acetaminophen (PERCOCET/ROXICET) 5-325 MG tablet Take 1 tablet by mouth every 4 (four) hours as needed for severe pain. 03/22/18   Rancour, Jeannett Senior, MD  tamsulosin (FLOMAX) 0.4 MG CAPS capsule Take 1 capsule (0.4 mg total) by mouth daily after supper. 03/22/18   Glynn Octave, MD    Physical Exam: Vitals:   07/28/18 0245 07/28/18 0300 07/28/18 0315 07/28/18 0330  BP: 121/80 98/67 124/77 (!)  122/91  Pulse: 82 65 63 94  Resp: 19 (!) 21 13 17   Temp:      TempSrc:      SpO2: 93% 95% 95% 96%  Weight:      Height:        Constitutional: NAD, calm, comfortable Eyes: PERRL, right eyelid and conjunctivae are injected ENMT: Mucous membranes are moist. Posterior pharynx clear of any exudate or lesions. Neck: normal, supple, no masses, no thyromegaly Respiratory: clear to auscultation bilaterally, no wheezing, no crackles. Normal respiratory effort. No accessory muscle use.  Cardiovascular: Regular rate and rhythm, no murmurs / rubs / gallops. No extremity edema. 2+ pedal pulses. No carotid bruits.  Abdomen: Obese, soft, no tenderness, no masses palpated. No hepatosplenomegaly. Bowel sounds positive.  Musculoskeletal: no clubbing / cyanosis. Good ROM, no contractures. Normal muscle tone.  Skin: no rashes, lesions, ulcers on limited dermatological examination. Neurologic: Right facial droop, other CN 2-12 grossly intact. Sensation intact, DTR normal. 3/5 LUE and 4/5 LLE weakness. Impaired coordination of RUE. Psychiatric: Normal judgment and insight. Alert and oriented x 3. Normal mood.   Labs on Admission: I have personally reviewed following labs and imaging studies  CBC: Recent Labs  Lab 07/28/18 0015  WBC 9.6  NEUTROABS 4.8  HGB 16.6  HCT 49.1  MCV 91.1  PLT 175   Basic Metabolic Panel: Recent Labs  Lab 07/28/18 0015  NA 139  K 3.6  CL 106  CO2 23  GLUCOSE 117*  BUN 11  CREATININE 0.85  CALCIUM 9.0   GFR: Estimated Creatinine Clearance: 162.5 mL/min (by C-G formula based on SCr of 0.85 mg/dL). Liver Function Tests: Recent Labs  Lab 07/28/18 0015  AST 28  ALT 28  ALKPHOS 77  BILITOT 0.7  PROT 7.5  ALBUMIN 3.8   No results for input(s): LIPASE, AMYLASE in the last 168 hours. No results for input(s): AMMONIA in the last 168 hours. Coagulation Profile: Recent Labs  Lab 07/28/18 0015  INR 1.82   Cardiac Enzymes: No results for input(s): CKTOTAL,  CKMB, CKMBINDEX, TROPONINI in the last 168 hours. BNP (last 3 results) No results for input(s): PROBNP in the last 8760 hours. HbA1C: No results for input(s): HGBA1C in the last 72 hours. CBG: No results for input(s): GLUCAP in the last 168 hours. Lipid Profile: No results for input(s): CHOL, HDL, LDLCALC, TRIG, CHOLHDL, LDLDIRECT in the last 72 hours. Thyroid Function Tests: No results for input(s): TSH, T4TOTAL, FREET4, T3FREE, THYROIDAB in the last 72 hours. Anemia Panel: No results for input(s): VITAMINB12, FOLATE, FERRITIN, TIBC, IRON, RETICCTPCT in the last 72 hours. Urine analysis:    Component Value Date/Time   COLORURINE YELLOW 07/28/2018 0050   APPEARANCEUR CLEAR 07/28/2018 0050   LABSPEC 1.008 07/28/2018 0050   PHURINE 5.0 07/28/2018 0050   GLUCOSEU NEGATIVE 07/28/2018 0050   HGBUR NEGATIVE 07/28/2018 0050   BILIRUBINUR NEGATIVE 07/28/2018 0050   KETONESUR NEGATIVE 07/28/2018 0050   PROTEINUR NEGATIVE 07/28/2018 0050   NITRITE  NEGATIVE 07/28/2018 0050   LEUKOCYTESUR NEGATIVE 07/28/2018 0050    Radiological Exams on Admission: Dg Chest 2 View  Result Date: 07/28/2018 CLINICAL DATA:  Chest pain onset today while sitting in a chair, headache and slurred speech since 1400 hours on Saturday EXAM: CHEST - 2 VIEW COMPARISON:  None FINDINGS: Normal heart size, mediastinal contours, and pulmonary vascularity. Lungs clear. No acute infiltrate, pleural effusion or pneumothorax. Bones appear demineralized. IMPRESSION: No acute abnormalities. Electronically Signed   By: Ulyses Southward M.D.   On: 07/28/2018 01:39   Ct Head Wo Contrast  Result Date: 07/28/2018 CLINICAL DATA:  Headache with slurred speech for 36 hours. EXAM: CT HEAD WITHOUT CONTRAST TECHNIQUE: Contiguous axial images were obtained from the base of the skull through the vertex without intravenous contrast. COMPARISON:  None. FINDINGS: Brain: No intracranial hemorrhage, mass effect, or midline shift. No hydrocephalus. The  basilar cisterns are patent. No evidence of territorial infarct or acute ischemia. No extra-axial or intracranial fluid collection. Vascular: No hyperdense vessel. Skull: No fracture or focal lesion. Sinuses/Orbits: No acute findings.  Bilateral cataract resection. Other: None. IMPRESSION: No acute intracranial abnormality. Electronically Signed   By: Narda Rutherford M.D.   On: 07/28/2018 01:52    EKG: Independently reviewed. Vent. rate 83 BPM PR interval * ms QRS duration 93 ms QT/QTc 370/435 ms P-R-T axes * 61 66 Atrial fibrillation Minimal ST depression, diffuse leads No previous EKGs on record.  Assessment/Plan Principal Problem:   Focal neurological deficit, onset greater than 24 hours Ipsilateral cranial nerve deficicit with contralateral extremities weakness. Gabriela Eves Syndrome? Observation/telemetry. Frequent neuro checks. PT/OT/SLP evaluation. Check fasting lipids. Check hemoglobin A1c. Check carotid Doppler. Check echocardiogram. Check MR MRA of brain without contrast. Consider neurology evaluation.  Active Problems:   Atrial fibrillation (HCC) CHA?DS?-VASc Scoreof at least 3. Not on rate control medication. Continue warfarin per pharmacy.    Pancreatic mass Being followed by his regular doctors. Had recent MRI of abdomen. Did not elaborate much about the results.    History of pulmonary embolus (PE) On warfarin per pharmacy.    History of DVT (deep vein thrombosis) On warfarin per pharmacy.    Tobacco use Nicotine replacement therapy ordered as needed. Staff will provide smoking cessation information.    DVT prophylaxis: On Warfarin. Code Status: Full code. Family Communication:  Disposition Plan: Observation for TIA work up. Consults called:  Admission status: Observation/Telemetry.   Bobette Mo MD [facility] Pager [phone].  If 7PM-7AM, please contact night-coverage [email or web address] Password [email or web address]  07/28/2018, 3:55 AM

## 2018-07-28 NOTE — Progress Notes (Addendum)
Patient seen and examined. Admitted after midnight secondary to CP, HA and slurred speech. Patient also expressed right facial droop and left sided weakness. Patient is currently hemodynamically stable and in no acute distress. He had prior hx of atrial fibrillation and is chronically on coumadin; INR was subtherapeutic on presentation. Please refer to H&P written by Dr. Robb Matar for further info/details on admission.  Plan: -complete TIA/stroke work up -cycle troponin and EKG -follow neurology consultation  -pharmacy to continue assisting with coumadin adjustments. -tobacco cessation counseling provided -follow clinical response   Roger Loll MD [phone]

## 2018-07-28 NOTE — Progress Notes (Signed)
*  PRELIMINARY RESULTS* Echocardiogram 2D Echocardiogram has been performed.  Jeryl Columbia 07/28/2018, 2:08 PM

## 2018-07-28 NOTE — ED Notes (Signed)
Patient in gown, on 12 lead cardiac monitoring at this time. EKG seen by Dr Preston Fleeting

## 2018-07-29 ENCOUNTER — Observation Stay (HOSPITAL_COMMUNITY)
Admit: 2018-07-29 | Discharge: 2018-07-29 | Disposition: A | Discharge status: Home / Self Care | Payer: Medicare PPO | Attending: Neurology | Admitting: Neurology

## 2018-07-29 DIAGNOSIS — R29818 Other symptoms and signs involving the nervous system: Secondary | ICD-10-CM | POA: Diagnosis not present

## 2018-07-29 DIAGNOSIS — I482 Chronic atrial fibrillation: Secondary | ICD-10-CM | POA: Diagnosis not present

## 2018-07-29 DIAGNOSIS — Z7901 Long term (current) use of anticoagulants: Secondary | ICD-10-CM | POA: Diagnosis not present

## 2018-07-29 DIAGNOSIS — K869 Disease of pancreas, unspecified: Secondary | ICD-10-CM

## 2018-07-29 DIAGNOSIS — Z86718 Personal history of other venous thrombosis and embolism: Secondary | ICD-10-CM | POA: Diagnosis not present

## 2018-07-29 DIAGNOSIS — Z86711 Personal history of pulmonary embolism: Secondary | ICD-10-CM

## 2018-07-29 LAB — PROTIME-INR
INR: 2.13
PROTHROMBIN TIME: 23.6 s — AB (ref 11.4–15.2)

## 2018-07-29 LAB — HEMOGLOBIN A1C
Hgb A1c MFr Bld: 5.8 % — ABNORMAL HIGH (ref 4.8–5.6)
MEAN PLASMA GLUCOSE: 120 mg/dL

## 2018-07-29 LAB — HIV ANTIBODY (ROUTINE TESTING W REFLEX): HIV SCREEN 4TH GENERATION: NONREACTIVE

## 2018-07-29 MED ORDER — WARFARIN SODIUM 7.5 MG PO TABS
7.5000 mg | ORAL_TABLET | Freq: Once | ORAL | Status: AC
  Administered 2018-07-29: 7.5 mg via ORAL
  Filled 2018-07-29: qty 1

## 2018-07-29 NOTE — Progress Notes (Signed)
OT Cancellation Note  Patient Details Name: Roger Phelps MRN: [id number] DOB: 02-17-1956   Cancelled Treatment:    Reason Eval/Treat Not Completed: OT screened, no needs identified, will sign off. Pt is at baseline for ADL completion. Strength is at baseline at 4+/5 in RUE and 4/5 in LUE. No further OT services required at this time.   Ezra Sites, OTR/L  [phone] 07/29/2018, 8:40 AM

## 2018-07-29 NOTE — Progress Notes (Signed)
ANTICOAGULATION CONSULT NOTE -   Pharmacy Consult for Warfarin  Indication: Atrial fibrillation  Allergies  Allergen Reactions  . Codeine Nausea Only    Patient Measurements: Height: 6\' 7"  (200.7 cm) Weight: (!) 373 lb 7.4 oz (169.4 kg) IBW/kg (Calculated) : 93.7 HEPARIN DW (KG): 132.6   Vital Signs: Temp: 97.2 F (36.2 C) (09/10 0726) Temp Source: Oral (09/10 0726) BP: 115/71 (09/10 0800) Pulse Rate: 61 (09/10 0800)  Labs: Recent Labs    07/28/18 0015 07/28/18 0537 07/28/18 1235 07/28/18 1353 07/29/18 0409  HGB 16.6  --   --   --   --   HCT 49.1  --   --   --   --   PLT 175  --   --   --   --   LABPROT 20.9*  --   --  20.5* 23.6*  INR 1.82  --   --  1.78 2.13  CREATININE 0.85  --   --   --   --   TROPONINI  --  <0.03 <0.03  --   --    Estimated Creatinine Clearance: 162.1 mL/min (by C-G formula based on SCr of 0.85 mg/dL).  Medical History: Past Medical History:  Diagnosis Date  . Atrial fibrillation (HCC)   . DVT (deep venous thrombosis) (HCC)   . Pancreatic mass   . Pulmonary embolism (HCC)   . Renal disorder    kidney stones  . Tobacco use     Medications:  Warfarin 10 mg PO daily. Last dose per patient at 1700 07/27/18  Assessment: 62 yo male with hx of chronic Afib, PE, and DVT seen in the ED for chest tightness radiating to his left arm.Pt also reports frontal headache x 24 hrs with slurred speech and observed right facial droop and left-sided extremity weakness. Pharmacy has been consulted for warfarin dosing.  Patient reports his last INR was 2.8 within the last week on his usual 10 mg daily dose. Currently is INR is therapeutic at 2.13  Goal of Therapy:  Therapeutic INR: 2-3  Plan:  Warfarin 7.5 mg PO x 1 dose Daily PT/INR    Judeth Cornfield, PharmD Clinical Pharmacist 07/29/2018 11:17 AM

## 2018-07-29 NOTE — Progress Notes (Signed)
SLP Cancellation Note  Patient Details Name: Roger Phelps MRN: [id number] DOB: 09/23/1956   Cancelled treatment:       Reason Eval/Treat Not Completed: SLP screened, no needs identified, will sign off; SLP screened Pt in room. Pt denies any changes in swallowing, speech, language, or cognition. MRI negative for acute changes. SLE will be deferred at this time. Reconsult if indicated. SLP will sign off.   Thank you,  Havery Moros, CCC-SLP [phone]  PORTER,DABNEY 07/29/2018, 5:28 PM

## 2018-07-29 NOTE — Care Management Note (Signed)
Case Management Note  Patient Details  Name: Roger Phelps MRN: [id number] Date of Birth: Feb 27, 1956  Subjective/Objective:  Stroke workup. From home alone, independent. Has RW, cane, WC, shower seat, and ramp going into house. Recommended for HH PT. Agreeable. Offered choice of [facility], no preference.              Action/Plan: Bonita Quin of [facility] notified and will obtain order via Epic. Attending notified for need of order. Anticipate DC home today.   Expected Discharge Date:  07/30/18               Expected Discharge Plan:  Home w Home Health Services  In-House Referral:     Discharge planning Services  CM Consult  Post Acute Care Choice:  Home Health Choice offered to:  Patient  DME Arranged:    DME Agency:     HH Arranged:  PT HH Agency:  [facility]  Status of Service:  Completed, signed off  If discussed at Long Length of Stay Meetings, dates discussed:    Additional Comments:  Brettney Ficken, Chrystine Oiler, RN 07/29/2018, 11:08 AM

## 2018-07-29 NOTE — Procedures (Signed)
  [facility] Lovetta Condie A. Gerilyn Pilgrim, MD     [email or web address]           HISTORY: This is a 62 year old who presents with confusion and other mental status.  The study is being done to evaluate for seizures as the etiology.  MEDICATIONS: Scheduled Meds: . aspirin  324 mg Oral Once  . Warfarin - Pharmacist Dosing Inpatient   Does not apply q1800   Continuous Infusions: PRN Meds:.acetaminophen **OR** acetaminophen (TYLENOL) oral liquid 160 mg/5 mL **OR** acetaminophen, nicotine, nitroGLYCERIN, ondansetron **OR** ondansetron (ZOFRAN) IV  Prior to Admission medications   Medication Sig Start Date End Date Taking? Authorizing Provider  b complex vitamins capsule Take 1 capsule by mouth daily.   Yes [provider]  Cholecalciferol (VITAMIN D-3) 5000 units TABS Take 10,000 Units by mouth daily.   Yes [provider]  Omega-3 Fatty Acids (FISH OIL) 1200 MG CAPS Take 2 capsules by mouth daily.   Yes [provider]  warfarin (COUMADIN) 10 MG tablet Take 10 mg by mouth daily.   Yes [provider]  ibuprofen (ADVIL,MOTRIN) 800 MG tablet Take 1 tablet (800 mg total) by mouth 3 (three) times daily. Patient not taking: Reported on 07/28/2018 03/22/18   Glynn Octave, MD  ondansetron (ZOFRAN ODT) 4 MG disintegrating tablet Take 1 tablet (4 mg total) by mouth every 8 (eight) hours as needed for nausea or vomiting. Patient not taking: Reported on 07/28/2018 03/22/18   Glynn Octave, MD  oxyCODONE-acetaminophen (PERCOCET/ROXICET) 5-325 MG tablet Take 1 tablet by mouth every 4 (four) hours as needed for severe pain. Patient not taking: Reported on 07/28/2018 03/22/18   Glynn Octave, MD  tamsulosin (FLOMAX) 0.4 MG CAPS capsule Take 1 capsule (0.4 mg total) by mouth daily after supper. Patient not taking: Reported on 07/28/2018 03/22/18   Glynn Octave, MD      ANALYSIS: A 16 channel recording using standard 10 20 measurements is conducted for 23 minutes.    There is a well-formed posterior dominant rhythm of 10-11 hertz which attenuates with eye-opening.  There is beta activity observed in the frontal areas.  Awake and drowsy activities are observed.  Photic stimulation and hyperventilation are not conducted.  There is no focal or lateralized slowing.  There is no epileptiform activity is observed.   IMPRESSION:   This is a normal recording of awake and drowsy states.      Allea Kassner A. Gerilyn Pilgrim, M.D.  Diplomate, Biomedical engineer of Psychiatry and Neurology ( Neurology).

## 2018-07-29 NOTE — Progress Notes (Signed)
EEG complete - results pending 

## 2018-07-29 NOTE — Progress Notes (Signed)
[facility] PROGRESS NOTE    Progress Note  Roger Phelps  MYT:[id number] DOB: [birth year] DOA: 07/28/2018 PCP: Dorice Lamas, MD     Brief Narrative:   Roger Phelps is an 62 y.o. male past medical history of chronic atrial fibrillation PE and DVT on chronic anticoagulation comes into the emergency room 4 days prior to admission for focal symptoms she related headaches followed by slurred speech throughout the day and weakness her symptoms it resolved when she got to the ED. Assessment/Plan:   Focal neurological deficit, onset greater than 24 hours MRI of the brain showed no acute abnormalities. Recommended an EEG to rule out seizures. They recommended to continue long-term warfarin and aspirin to be discontinued in a week. Chronic Atrial fibrillation (HCC)  Pancreatic mass: Had a recent MRI of the abdomen. We will follow-up with physician as an outpatient.  History of pulmonary embolus (PE)/History of DVT (deep vein thrombosis): Continue Coumadin per pharmacy.   DVT prophylaxis: warfarrin Family Communication:None Disposition Plan/Barrier to D/C: hopefully today after Neurology recomendations Code Status:     Code Status Orders  (From admission, onward)         Start     Ordered   07/28/18 0338  Full code  Continuous     07/28/18 0339        Code Status History    Date Active Date Inactive Code Status Order ID Comments User Context   07/28/2018 0254 07/28/2018 0339 Full Code [id number]  Bobette Mo, MD ED   07/28/2018 0254 07/28/2018 0254 Full Code [id number]  Bobette Mo, MD ED        IV Access:    Peripheral IV   Procedures and diagnostic studies:   Dg Chest 2 View  Result Date: 07/28/2018 CLINICAL DATA:  Chest pain onset today while sitting in a chair, headache and slurred speech since 1400 hours on Saturday EXAM: CHEST - 2 VIEW COMPARISON:  None FINDINGS: Normal heart size, mediastinal contours, and pulmonary vascularity. Lungs  clear. No acute infiltrate, pleural effusion or pneumothorax. Bones appear demineralized. IMPRESSION: No acute abnormalities. Electronically Signed   By: Ulyses Southward M.D.   On: 07/28/2018 01:39   Ct Head Wo Contrast  Result Date: 07/28/2018 CLINICAL DATA:  Headache with slurred speech for 36 hours. EXAM: CT HEAD WITHOUT CONTRAST TECHNIQUE: Contiguous axial images were obtained from the base of the skull through the vertex without intravenous contrast. COMPARISON:  None. FINDINGS: Brain: No intracranial hemorrhage, mass effect, or midline shift. No hydrocephalus. The basilar cisterns are patent. No evidence of territorial infarct or acute ischemia. No extra-axial or intracranial fluid collection. Vascular: No hyperdense vessel. Skull: No fracture or focal lesion. Sinuses/Orbits: No acute findings.  Bilateral cataract resection. Other: None. IMPRESSION: No acute intracranial abnormality. Electronically Signed   By: Narda Rutherford M.D.   On: 07/28/2018 01:52   Mr Brain Wo Contrast  Result Date: 07/28/2018 CLINICAL DATA:  Right facial droop with left-sided weakness and slurred speech. Atrial fibrillation history. EXAM: MRI HEAD WITHOUT CONTRAST MRA HEAD WITHOUT CONTRAST TECHNIQUE: Multiplanar, multiecho pulse sequences of the brain and surrounding structures were obtained without intravenous contrast. Angiographic images of the head were obtained using MRA technique without contrast. COMPARISON:  Head CT same day FINDINGS: MRI HEAD FINDINGS Brain: Diffusion imaging does not show any acute or subacute infarction. The brainstem and cerebellum are normal. Cerebral hemispheres appear normal without evidence of atrophy or old small or large vessel infarction. No mass lesion, hemorrhage,  hydrocephalus or extra-axial collection. Patient would not allow complete imaging. Vascular: Major vessels at the base of the brain show flow. Skull and upper cervical spine: Negative Sinuses/Orbits: Clear/normal Other: None MRA HEAD  FINDINGS Motion degraded exam. Both internal carotid arteries are patent through the skull base. The anterior and middle cerebral vessels show flow. Both vertebral arteries show flow to the basilar. There is flow in the basilar. Both posterior cerebral arteries show flow. IMPRESSION: Motion degraded and abbreviated exam. No acute intracranial finding. No sign of old infarction. Motion degraded intracranial MR angiography shows flow in the major vessels. Electronically Signed   By: Paulina Fusi M.D.   On: 07/28/2018 11:54   US Carotid Bilateral (at [facility] And [facility] Only)  Result Date: 07/28/2018 CLINICAL DATA:  Right-sided facial droop and left-sided weakness for 1 day. Visual disturbance. History of atrial fibrillation and smoking. EXAM: BILATERAL CAROTID DUPLEX ULTRASOUND TECHNIQUE: Wallace Cullens scale imaging, color Doppler and duplex ultrasound were performed of bilateral carotid and vertebral arteries in the neck. COMPARISON:  None. FINDINGS: Criteria: Quantification of carotid stenosis is based on velocity parameters that correlate the residual internal carotid diameter with NASCET-based stenosis levels, using the diameter of the distal internal carotid lumen as the denominator for stenosis measurement. The following velocity measurements were obtained: RIGHT ICA:  102/33 cm/sec CCA:  112/18 cm/sec SYSTOLIC ICA/CCA RATIO:  0.9 ECA:  111 cm/sec LEFT ICA:  89/30 cm/sec CCA:  119/22 cm/sec SYSTOLIC ICA/CCA RATIO:  0.8 ECA:  128 cm/sec RIGHT CAROTID ARTERY: There is a minimal amount of echogenic plaque within the right carotid bulb (images 15 and 17), extending to involve the origin and proximal aspects of the right internal carotid artery (image 25), not resulting in elevated peak systolic velocities within the interrogated course of the right internal carotid artery to suggest a hemodynamically significant stenosis. RIGHT VERTEBRAL ARTERY:  Antegrade flow LEFT CAROTID ARTERY: There is a minimal to moderate amount of  echogenic partially shadowing plaque within the left carotid bulb (image 51), extending to involve the origin and proximal aspects of the left internal carotid artery (image 59), not resulting in elevated peak systolic velocities within the interrogated course of the left internal carotid artery to suggest a hemodynamically significant stenosis. LEFT VERTEBRAL ARTERY:  Antegrade Flow Note is made of a cardiac arrhythmia compatible with provided history of atrial fibrillation. IMPRESSION: Minimal to moderate amount of bilateral atherosclerotic plaque, left subjectively greater than right, not resulting in a hemodynamically significant stenosis within either internal carotid artery. Electronically Signed   By: Simonne Come M.D.   On: 07/28/2018 11:34   Mr Maxine Glenn Head/brain ZO Cm  Result Date: 07/28/2018 CLINICAL DATA:  Right facial droop with left-sided weakness and slurred speech. Atrial fibrillation history. EXAM: MRI HEAD WITHOUT CONTRAST MRA HEAD WITHOUT CONTRAST TECHNIQUE: Multiplanar, multiecho pulse sequences of the brain and surrounding structures were obtained without intravenous contrast. Angiographic images of the head were obtained using MRA technique without contrast. COMPARISON:  Head CT same day FINDINGS: MRI HEAD FINDINGS Brain: Diffusion imaging does not show any acute or subacute infarction. The brainstem and cerebellum are normal. Cerebral hemispheres appear normal without evidence of atrophy or old small or large vessel infarction. No mass lesion, hemorrhage, hydrocephalus or extra-axial collection. Patient would not allow complete imaging. Vascular: Major vessels at the base of the brain show flow. Skull and upper cervical spine: Negative Sinuses/Orbits: Clear/normal Other: None MRA HEAD FINDINGS Motion degraded exam. Both internal carotid arteries are patent through the skull base.  The anterior and middle cerebral vessels show flow. Both vertebral arteries show flow to the basilar. There is flow  in the basilar. Both posterior cerebral arteries show flow. IMPRESSION: Motion degraded and abbreviated exam. No acute intracranial finding. No sign of old infarction. Motion degraded intracranial MR angiography shows flow in the major vessels. Electronically Signed   By: Paulina Fusi M.D.   On: 07/28/2018 11:54     Medical Consultants:    None.  Anti-Infectives:   None  Subjective:    Roger Phelps he relates he feels great no new complaints wants to go home.  Objective:    Vitals:   07/28/18 2036 07/29/18 0000 07/29/18 0400 07/29/18 0726  BP: 114/60 113/82    Pulse: 66 64  (!) 46  Resp: 20 20  15   Temp:  98.1 F (36.7 C) (!) 97.4 F (36.3 C) (!) 97.2 F (36.2 C)  TempSrc:  Oral Oral Oral  SpO2: 96% 92%  98%  Weight:   (!) 169.4 kg   Height:        Intake/Output Summary (Last 24 hours) at 07/29/2018 0755 Last data filed at 07/29/2018 0012 Gross per 24 hour  Intake -  Output 250 ml  Net -250 ml   Filed Weights   07/28/18 0010 07/28/18 0500 07/29/18 0400  Weight: (!) 170.1 kg (!) 168.7 kg (!) 169.4 kg    Exam: General exam: In no acute distress. Respiratory system: Good air movement and clear to auscultation. Cardiovascular system: S1 & S2 heard, RRR.  Gastrointestinal system: Abdomen is nondistended, soft and nontender.  Central nervous system: Alert and oriented. No focal neurological deficits. Extremities: No pedal edema. Skin: No rashes, lesions or ulcers Psychiatry: Judgement and insight appear normal. Mood & affect appropriate.    Data Reviewed:    Labs: Basic Metabolic Panel: Recent Labs  Lab 07/28/18 0015  NA 139  K 3.6  CL 106  CO2 23  GLUCOSE 117*  BUN 11  CREATININE 0.85  CALCIUM 9.0   GFR Estimated Creatinine Clearance: 162.1 mL/min (by C-G formula based on SCr of 0.85 mg/dL). Liver Function Tests: Recent Labs  Lab 07/28/18 0015  AST 28  ALT 28  ALKPHOS 77  BILITOT 0.7  PROT 7.5  ALBUMIN 3.8   No results for input(s):  LIPASE, AMYLASE in the last 168 hours. No results for input(s): AMMONIA in the last 168 hours. Coagulation profile Recent Labs  Lab 07/28/18 0015 07/28/18 1353 07/29/18 0409  INR 1.82 1.78 2.13    CBC: Recent Labs  Lab 07/28/18 0015  WBC 9.6  NEUTROABS 4.8  HGB 16.6  HCT 49.1  MCV 91.1  PLT 175   Cardiac Enzymes: Recent Labs  Lab 07/28/18 0537 07/28/18 1235  TROPONINI <0.03 <0.03   BNP (last 3 results) No results for input(s): PROBNP in the last 8760 hours. CBG: No results for input(s): GLUCAP in the last 168 hours. D-Dimer: No results for input(s): DDIMER in the last 72 hours. Hgb A1c: Recent Labs    07/28/18 0537  HGBA1C 5.8*   Lipid Profile: Recent Labs    07/28/18 0537  CHOL 121  HDL 22*  LDLCALC 83  TRIG 80  CHOLHDL 5.5   Thyroid function studies: No results for input(s): TSH, T4TOTAL, T3FREE, THYROIDAB in the last 72 hours.  Invalid input(s): FREET3 Anemia work up: No results for input(s): VITAMINB12, FOLATE, FERRITIN, TIBC, IRON, RETICCTPCT in the last 72 hours. Sepsis Labs: Recent Labs  Lab 07/28/18 0015  WBC  9.6   Microbiology Recent Results (from the past 240 hour(s))  MRSA PCR Screening     Status: None   Collection Time: 07/28/18  4:01 AM  Result Value Ref Range Status   MRSA by PCR NEGATIVE NEGATIVE Final    Comment:        The GeneXpert MRSA Assay (FDA approved for NASAL specimens only), is one component of a comprehensive MRSA colonization surveillance program. It is not intended to diagnose MRSA infection nor to guide or monitor treatment for MRSA infections. Performed at [facility], [address] [place], [place] [address]      Medications:   . aspirin  324 mg Oral Once  . Warfarin - Pharmacist Dosing Inpatient   Does not apply q1800   Continuous Infusions:    LOS: 0 days   Marinda Elk  [facility] Pager [phone]  *Please refer to [email or web address], password [email or web address] to get updated schedule on who  will round on this patient, as hospitalists switch teams weekly. If 7PM-7AM, please contact night-coverage at [email or web address], password [email or web address] for any overnight needs.  07/29/2018, 7:55 AM

## 2018-07-29 NOTE — Progress Notes (Signed)
Physical Therapy Treatment Patient Details Name: Roger Phelps MRN: [id number] DOB: 03/27/1956 Today's Date: 07/29/2018    History of Present Illness Roger Phelps is a 62 y.o. male with medical history significant of chronic atrial fibrillation, history of PE, history of DVT, pancreatic mass, history of urolithiasis, tobacco use who is coming to the emergency department due to chest tightness that started Sunday evening while he was seeding in his chair which radiates to his left arm, associated with mild dyspnea, but denies nausea, emesis, diaphoresis or palpitations.  There are no relieving or worsening factors.  He was given aspirin and sublingual nitroglycerin in route to the hospital without any significant changes to the pain.  He also complains of having a frontal headache since Saturday evening associated with slurred speech and he has been noticed to have right facial droop and left-sided extremities weakness in the emergency department.  He was last known to be well on Saturday.  He has had some dizziness and gait disturbance while ambulating.  He denies fever, chills, sore throat, wheezing, hemoptysis, diarrhea, constipation, melena or hematochezia.  No dysuria, frequency or hematuria.  Denies polyuria, polydipsia, polyphagia or blurred vision.  Denies pruritus or skin rashes.    PT Comments    Patient presents up in chair and demonstrates much improvement in endurance/distance for ambulation in hallway without loss of balance or leaning on nearby objects for support.  Patient able to complete exercises without problem or c/o pain and tolerated staying up in chair after therapy.  Patient will benefit from continued physical therapy in hospital and recommended venue below to increase strength, balance, endurance for safe ADLs and gait.   Follow Up Recommendations  Home health PT;Outpatient PT     Equipment Recommendations  None recommended by PT    Recommendations for Other Services        Precautions / Restrictions Precautions Precautions: None Restrictions Weight Bearing Restrictions: No    Mobility  Bed Mobility               General bed mobility comments: patient presents up in chair  Transfers Overall transfer level: Modified independent   Transfers: Sit to/from Stand;Stand Pivot Transfers Sit to Stand: Modified independent (Device/Increase time) Stand pivot transfers: Modified independent (Device/Increase time)       General transfer comment: much improvement  Ambulation/Gait Ambulation/Gait assistance: Modified independent (Device/Increase time) Gait Distance (Feet): 100 Feet Assistive device: None Gait Pattern/deviations: WFL(Within Functional Limits) Gait velocity: decreased   General Gait Details: grossly WFL other than slightly slower than normal cadence, no loss of balance   Stairs             Wheelchair Mobility    Modified Rankin (Stroke Patients Only)       Balance Overall balance assessment: No apparent balance deficits (not formally assessed)                                          Cognition Arousal/Alertness: Awake/alert Behavior During Therapy: WFL for tasks assessed/performed Overall Cognitive Status: Within Functional Limits for tasks assessed                                        Exercises General Exercises - Lower Extremity Long Arc Quad: Seated;AROM;Strengthening;Both;10 reps Hip Flexion/Marching: Seated;AROM;Strengthening;Both;10 reps Toe  Raises: Seated;Strengthening;AROM;Both;10 reps Heel Raises: Seated;AROM;Strengthening;Both;10 reps    General Comments        Pertinent Vitals/Pain Pain Assessment: No/denies pain    Home Living                      Prior Function            PT Goals (current goals can now be found in the care plan section) Acute Rehab PT Goals Patient Stated Goal: return home PT Goal Formulation: With patient Time For  Goal Achievement: 08/01/18 Potential to Achieve Goals: Good Progress towards PT goals: Progressing toward goals    Frequency    Min 3X/week      PT Plan Current plan remains appropriate    Co-evaluation              AM-PAC PT "6 Clicks" Daily Activity  Outcome Measure  Difficulty turning over in bed (including adjusting bedclothes, sheets and blankets)?: None Difficulty moving from lying on back to sitting on the side of the bed? : None Difficulty sitting down on and standing up from a chair with arms (e.g., wheelchair, bedside commode, etc,.)?: None Help needed moving to and from a bed to chair (including a wheelchair)?: None Help needed walking in hospital room?: None Help needed climbing 3-5 steps with a railing? : A Little 6 Click Score: 23    End of Session   Activity Tolerance: Patient tolerated treatment well;Patient limited by fatigue Patient left: in chair;with call bell/phone within reach Nurse Communication: Mobility status PT Visit Diagnosis: Unsteadiness on feet (R26.81);Other abnormalities of gait and mobility (R26.89);Muscle weakness (generalized) (M62.81)     Time: 4628-6381 PT Time Calculation (min) (ACUTE ONLY): 22 min  Charges:  $Therapeutic Activity: 8-22 mins                     2:19 PM, 07/29/18 Ocie Bob, MPT Physical Therapist with [facility] [phone] office [phone] mobile phone

## 2018-07-30 LAB — HIV ANTIBODY (ROUTINE TESTING W REFLEX): HIV SCREEN 4TH GENERATION: NONREACTIVE

## 2018-07-31 NOTE — Discharge Summary (Signed)
Physician Discharge Summary  Roger Phelps ZOX:[id number] DOB: [birth year] DOA: 07/28/2018  PCP: Dorice Lamas, MD  Admit date: 07/28/2018 Discharge date: 07/31/2018  Admitted From: home Disposition:  Home  Recommendations for Outpatient Follow-up:  1. Follow up with PCP in 1-2 weeks 2. Please obtain BMP/CBC in one week   Home Health:no Equipment/Devices: None  Discharge Condition:Stable CODE STATUS:full Diet recommendation: Heart Healthy   Brief/Interim Summary: 62 y.o. male past medical history of chronic atrial fibrillation PE and DVT on chronic anticoagulation comes into the emergency room 4 days prior to admission for focal symptoms she related headaches followed by slurred speech throughout the day and weakness her symptoms it resolved when she got to the ED.  Discharge Diagnoses:  Principal Problem:   Focal neurological deficit, onset greater than 24 hours Active Problems:   Atrial fibrillation (HCC)   Pancreatic mass   History of pulmonary embolus (PE)   History of DVT (deep vein thrombosis)   Tobacco use Focal neurological deficits with onset greater than 24 hours: MRI of the brain was done that showed no acute abnormalities.  Neurology was consulted recommended an EEG that showed no focal seizures. They recommended to continue long-term warfarin and aspirin to be discontinued in 1 week.  Chronic atrial fibrillation: Rate controlled INR therapeutic.  Pancreatic mass: Had a recent MRI of the abdomen by PCP as an outpatient will follow-up the results with PCP.  History of pulmonary embolism: Continue Coumadin as an outpatient.   Discharge Instructions  Discharge Instructions    Diet - low sodium heart healthy   Complete by:  As directed    Increase activity slowly   Complete by:  As directed      Allergies as of 07/29/2018      Reactions   Codeine Nausea Only      Medication List    TAKE these medications   b complex vitamins capsule Take 1  capsule by mouth daily.   Fish Oil 1200 MG Caps Take 2 capsules by mouth daily.   ibuprofen 800 MG tablet Commonly known as:  ADVIL,MOTRIN Take 1 tablet (800 mg total) by mouth 3 (three) times daily.   ondansetron 4 MG disintegrating tablet Commonly known as:  ZOFRAN-ODT Take 1 tablet (4 mg total) by mouth every 8 (eight) hours as needed for nausea or vomiting.   oxyCODONE-acetaminophen 5-325 MG tablet Commonly known as:  PERCOCET/ROXICET Take 1 tablet by mouth every 4 (four) hours as needed for severe pain.   tamsulosin 0.4 MG Caps capsule Commonly known as:  FLOMAX Take 1 capsule (0.4 mg total) by mouth daily after supper.   Vitamin D-3 5000 units Tabs Take 10,000 Units by mouth daily.   warfarin 10 MG tablet Commonly known as:  COUMADIN Take 10 mg by mouth daily.       Allergies  Allergen Reactions  . Codeine Nausea Only    Consultations:  Neurology   Procedures/Studies: Dg Chest 2 View  Result Date: 07/28/2018 CLINICAL DATA:  Chest pain onset today while sitting in a chair, headache and slurred speech since 1400 hours on Saturday EXAM: CHEST - 2 VIEW COMPARISON:  None FINDINGS: Normal heart size, mediastinal contours, and pulmonary vascularity. Lungs clear. No acute infiltrate, pleural effusion or pneumothorax. Bones appear demineralized. IMPRESSION: No acute abnormalities. Electronically Signed   By: Ulyses Southward M.D.   On: 07/28/2018 01:39   Ct Head Wo Contrast  Result Date: 07/28/2018 CLINICAL DATA:  Headache with slurred speech for 36 hours.  EXAM: CT HEAD WITHOUT CONTRAST TECHNIQUE: Contiguous axial images were obtained from the base of the skull through the vertex without intravenous contrast. COMPARISON:  None. FINDINGS: Brain: No intracranial hemorrhage, mass effect, or midline shift. No hydrocephalus. The basilar cisterns are patent. No evidence of territorial infarct or acute ischemia. No extra-axial or intracranial fluid collection. Vascular: No hyperdense  vessel. Skull: No fracture or focal lesion. Sinuses/Orbits: No acute findings.  Bilateral cataract resection. Other: None. IMPRESSION: No acute intracranial abnormality. Electronically Signed   By: Narda RutherfordMelanie  Sanford M.D.   On: 07/28/2018 01:52   Mr Brain Wo Contrast  Result Date: 07/28/2018 CLINICAL DATA:  Right facial droop with left-sided weakness and slurred speech. Atrial fibrillation history. EXAM: MRI HEAD WITHOUT CONTRAST MRA HEAD WITHOUT CONTRAST TECHNIQUE: Multiplanar, multiecho pulse sequences of the brain and surrounding structures were obtained without intravenous contrast. Angiographic images of the head were obtained using MRA technique without contrast. COMPARISON:  Head CT same day FINDINGS: MRI HEAD FINDINGS Brain: Diffusion imaging does not show any acute or subacute infarction. The brainstem and cerebellum are normal. Cerebral hemispheres appear normal without evidence of atrophy or old small or large vessel infarction. No mass lesion, hemorrhage, hydrocephalus or extra-axial collection. Patient would not allow complete imaging. Vascular: Major vessels at the base of the brain show flow. Skull and upper cervical spine: Negative Sinuses/Orbits: Clear/normal Other: None MRA HEAD FINDINGS Motion degraded exam. Both internal carotid arteries are patent through the skull base. The anterior and middle cerebral vessels show flow. Both vertebral arteries show flow to the basilar. There is flow in the basilar. Both posterior cerebral arteries show flow. IMPRESSION: Motion degraded and abbreviated exam. No acute intracranial finding. No sign of old infarction. Motion degraded intracranial MR angiography shows flow in the major vessels. Electronically Signed   By: Paulina FusiMark  Shogry M.D.   On: 07/28/2018 11:54   [place] Carotid Bilateral (at [facility] And [facility] Only)  Result Date: 07/28/2018 CLINICAL DATA:  Right-sided facial droop and left-sided weakness for 1 day. Visual disturbance. History of atrial fibrillation and  smoking. EXAM: BILATERAL CAROTID DUPLEX ULTRASOUND TECHNIQUE: Wallace CullensGray scale imaging, color Doppler and duplex ultrasound were performed of bilateral carotid and vertebral arteries in the neck. COMPARISON:  None. FINDINGS: Criteria: Quantification of carotid stenosis is based on velocity parameters that correlate the residual internal carotid diameter with NASCET-based stenosis levels, using the diameter of the distal internal carotid lumen as the denominator for stenosis measurement. The following velocity measurements were obtained: RIGHT ICA:  102/33 cm/sec CCA:  112/18 cm/sec SYSTOLIC ICA/CCA RATIO:  0.9 ECA:  111 cm/sec LEFT ICA:  89/30 cm/sec CCA:  119/22 cm/sec SYSTOLIC ICA/CCA RATIO:  0.8 ECA:  128 cm/sec RIGHT CAROTID ARTERY: There is a minimal amount of echogenic plaque within the right carotid bulb (images 15 and 17), extending to involve the origin and proximal aspects of the right internal carotid artery (image 25), not resulting in elevated peak systolic velocities within the interrogated course of the right internal carotid artery to suggest a hemodynamically significant stenosis. RIGHT VERTEBRAL ARTERY:  Antegrade flow LEFT CAROTID ARTERY: There is a minimal to moderate amount of echogenic partially shadowing plaque within the left carotid bulb (image 51), extending to involve the origin and proximal aspects of the left internal carotid artery (image 59), not resulting in elevated peak systolic velocities within the interrogated course of the left internal carotid artery to suggest a hemodynamically significant stenosis. LEFT VERTEBRAL ARTERY:  Antegrade Flow Note is made of a cardiac  arrhythmia compatible with provided history of atrial fibrillation. IMPRESSION: Minimal to moderate amount of bilateral atherosclerotic plaque, left subjectively greater than right, not resulting in a hemodynamically significant stenosis within either internal carotid artery. Electronically Signed   By: Simonne Come M.D.    On: 07/28/2018 11:34   Mr Maxine Glenn Head/brain ZO Cm  Result Date: 07/28/2018 CLINICAL DATA:  Right facial droop with left-sided weakness and slurred speech. Atrial fibrillation history. EXAM: MRI HEAD WITHOUT CONTRAST MRA HEAD WITHOUT CONTRAST TECHNIQUE: Multiplanar, multiecho pulse sequences of the brain and surrounding structures were obtained without intravenous contrast. Angiographic images of the head were obtained using MRA technique without contrast. COMPARISON:  Head CT same day FINDINGS: MRI HEAD FINDINGS Brain: Diffusion imaging does not show any acute or subacute infarction. The brainstem and cerebellum are normal. Cerebral hemispheres appear normal without evidence of atrophy or old small or large vessel infarction. No mass lesion, hemorrhage, hydrocephalus or extra-axial collection. Patient would not allow complete imaging. Vascular: Major vessels at the base of the brain show flow. Skull and upper cervical spine: Negative Sinuses/Orbits: Clear/normal Other: None MRA HEAD FINDINGS Motion degraded exam. Both internal carotid arteries are patent through the skull base. The anterior and middle cerebral vessels show flow. Both vertebral arteries show flow to the basilar. There is flow in the basilar. Both posterior cerebral arteries show flow. IMPRESSION: Motion degraded and abbreviated exam. No acute intracranial finding. No sign of old infarction. Motion degraded intracranial MR angiography shows flow in the major vessels. Electronically Signed   By: Paulina Fusi M.D.   On: 07/28/2018 11:54     Subjective: No new complaints.  Discharge Exam: Vitals:   07/29/18 1125 07/29/18 1605  BP:    Pulse: 66 94  Resp: (!) 32 19  Temp: 97.6 F (36.4 C) 97.6 F (36.4 C)  SpO2: 97% 98%   Vitals:   07/29/18 0800 07/29/18 1012 07/29/18 1125 07/29/18 1605  BP: 115/71     Pulse: 61  66 94  Resp: 19  (!) 32 19  Temp:   97.6 F (36.4 C) 97.6 F (36.4 C)  TempSrc:   Oral Oral  SpO2: 95% 94% 97% 98%   Weight:      Height:        General: Pt is alert, awake, not in acute distress Cardiovascular: RRR, S1/S2 +, no rubs, no gallops Respiratory: CTA bilaterally, no wheezing, no rhonchi Abdominal: Soft, NT, ND, bowel sounds + Extremities: no edema, no cyanosis    The results of significant diagnostics from this hospitalization (including imaging, microbiology, ancillary and laboratory) are listed below for reference.     Microbiology: Recent Results (from the past 240 hour(s))  MRSA PCR Screening     Status: None   Collection Time: 07/28/18  4:01 AM  Result Value Ref Range Status   MRSA by PCR NEGATIVE NEGATIVE Final    Comment:        The GeneXpert MRSA Assay (FDA approved for NASAL specimens only), is one component of a comprehensive MRSA colonization surveillance program. It is not intended to diagnose MRSA infection nor to guide or monitor treatment for MRSA infections. Performed at [facility], [address], [place], [place] [address]      Labs: BNP (last 3 results) No results for input(s): BNP in the last 8760 hours. Basic Metabolic Panel: Recent Labs  Lab 07/28/18 0015  NA 139  K 3.6  CL 106  CO2 23  GLUCOSE 117*  BUN 11  CREATININE  0.85  CALCIUM 9.0   Liver Function Tests: Recent Labs  Lab 07/28/18 0015  AST 28  ALT 28  ALKPHOS 77  BILITOT 0.7  PROT 7.5  ALBUMIN 3.8   No results for input(s): LIPASE, AMYLASE in the last 168 hours. No results for input(s): AMMONIA in the last 168 hours. CBC: Recent Labs  Lab 07/28/18 0015  WBC 9.6  NEUTROABS 4.8  HGB 16.6  HCT 49.1  MCV 91.1  PLT 175   Cardiac Enzymes: Recent Labs  Lab 07/28/18 0537 07/28/18 1235  TROPONINI <0.03 <0.03   BNP: Invalid input(s): POCBNP CBG: No results for input(s): GLUCAP in the last 168 hours. D-Dimer No results for input(s): DDIMER in the last 72 hours. Hgb A1c No results for input(s): HGBA1C in the last 72 hours. Lipid Profile No results for input(s):  CHOL, HDL, LDLCALC, TRIG, CHOLHDL, LDLDIRECT in the last 72 hours. Thyroid function studies No results for input(s): TSH, T4TOTAL, T3FREE, THYROIDAB in the last 72 hours.  Invalid input(s): FREET3 Anemia work up No results for input(s): VITAMINB12, FOLATE, FERRITIN, TIBC, IRON, RETICCTPCT in the last 72 hours. Urinalysis    Component Value Date/Time   COLORURINE YELLOW 07/28/2018 0050   APPEARANCEUR CLEAR 07/28/2018 0050   LABSPEC 1.008 07/28/2018 0050   PHURINE 5.0 07/28/2018 0050   GLUCOSEU NEGATIVE 07/28/2018 0050   HGBUR NEGATIVE 07/28/2018 0050   BILIRUBINUR NEGATIVE 07/28/2018 0050   KETONESUR NEGATIVE 07/28/2018 0050   PROTEINUR NEGATIVE 07/28/2018 0050   NITRITE NEGATIVE 07/28/2018 0050   LEUKOCYTESUR NEGATIVE 07/28/2018 0050   Sepsis Labs Invalid input(s): PROCALCITONIN,  WBC,  LACTICIDVEN Microbiology Recent Results (from the past 240 hour(s))  MRSA PCR Screening     Status: None   Collection Time: 07/28/18  4:01 AM  Result Value Ref Range Status   MRSA by PCR NEGATIVE NEGATIVE Final    Comment:        The GeneXpert MRSA Assay (FDA approved for NASAL specimens only), is one component of a comprehensive MRSA colonization surveillance program. It is not intended to diagnose MRSA infection nor to guide or monitor treatment for MRSA infections. Performed at [facility], [address], [place], [place] [address]      Time coordinating discharge: 35 minutes  SIGNED:   Marinda Elk, MD  [facility] 07/31/2018, 9:04 AM Pager   If 7PM-7AM, please contact night-coverage [email or web address] Password [email or web address]

## 2018-08-13 DIAGNOSIS — G459 Transient cerebral ischemic attack, unspecified: Secondary | ICD-10-CM

## 2018-10-12 ENCOUNTER — Emergency Department (HOSPITAL_COMMUNITY)
Admission: EM | Admit: 2018-10-12 | Discharge: 2018-10-13 | Disposition: A | Discharge status: Home / Self Care | Payer: Medicare PPO | Attending: Emergency Medicine | Admitting: Emergency Medicine

## 2018-10-12 ENCOUNTER — Other Ambulatory Visit: Payer: Self-pay

## 2018-10-12 ENCOUNTER — Emergency Department (HOSPITAL_COMMUNITY): Payer: Medicare PPO

## 2018-10-12 ENCOUNTER — Encounter (HOSPITAL_COMMUNITY): Payer: Self-pay | Admitting: Emergency Medicine

## 2018-10-12 DIAGNOSIS — Z86711 Personal history of pulmonary embolism: Secondary | ICD-10-CM | POA: Diagnosis not present

## 2018-10-12 DIAGNOSIS — R519 Headache, unspecified: Secondary | ICD-10-CM

## 2018-10-12 DIAGNOSIS — Z7901 Long term (current) use of anticoagulants: Secondary | ICD-10-CM | POA: Diagnosis not present

## 2018-10-12 DIAGNOSIS — R51 Headache: Secondary | ICD-10-CM | POA: Insufficient documentation

## 2018-10-12 DIAGNOSIS — Z79899 Other long term (current) drug therapy: Secondary | ICD-10-CM | POA: Insufficient documentation

## 2018-10-12 DIAGNOSIS — Z8673 Personal history of transient ischemic attack (TIA), and cerebral infarction without residual deficits: Secondary | ICD-10-CM | POA: Diagnosis not present

## 2018-10-12 DIAGNOSIS — F17200 Nicotine dependence, unspecified, uncomplicated: Secondary | ICD-10-CM | POA: Insufficient documentation

## 2018-10-12 DIAGNOSIS — M542 Cervicalgia: Secondary | ICD-10-CM | POA: Insufficient documentation

## 2018-10-12 DIAGNOSIS — Z8679 Personal history of other diseases of the circulatory system: Secondary | ICD-10-CM | POA: Diagnosis not present

## 2018-10-12 HISTORY — DX: Transient cerebral ischemic attack, unspecified: G45.9

## 2018-10-12 MED ORDER — TRAMADOL HCL 50 MG PO TABS: 50.0000 mg | ORAL_TABLET | Freq: Four times a day (QID) | ORAL | 0 refills | Status: DC | PRN

## 2018-10-12 MED ORDER — HYDROCODONE-ACETAMINOPHEN 5-325 MG PO TABS
1.0000 | ORAL_TABLET | Freq: Once | ORAL | Status: AC
  Administered 2018-10-12: 1 via ORAL
  Filled 2018-10-12: qty 1

## 2018-10-12 NOTE — ED Triage Notes (Signed)
Pt states he has headache and pain in R side of neck that he noticed approximately 2 hrs ago. Pt states he checked his BP at home and it sbp was 180. Pt not on any BP meds. Pt c/o half of his lips being numb as well.

## 2018-10-12 NOTE — ED Provider Notes (Signed)
[facility] EMERGENCY DEPARTMENT Provider Note   CSN: [id number] Arrival date & time: 10/12/18  1844     History   Chief Complaint Chief Complaint  Patient presents with  . Hypertension    HPI Roger Phelps is a 62 y.o. male.  Patient complains of a headache and some neck pain.  He states he has similar symptoms when he was admitted before to rule out a stroke.  Patient has a history of atrial fib and is on Coumadin  The history is provided by the patient. No language interpreter was used.  Illness  This is a new problem. The current episode started 6 to 12 hours ago. The problem occurs constantly. The problem has not changed since onset.Pertinent negatives include no chest pain, no abdominal pain and no headaches. Nothing aggravates the symptoms. Nothing relieves the symptoms. He has tried nothing for the symptoms. The treatment provided no relief.    Past Medical History:  Diagnosis Date  . Atrial fibrillation (HCC)   . DVT (deep venous thrombosis) (HCC)   . Pancreatic mass   . Pulmonary embolism (HCC)   . Renal disorder    kidney stones  . TIA (transient ischemic attack)   . Tobacco use     Patient Active Problem List   Diagnosis Date Noted  . 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 Atrial fibrillation (HCC) 07/28/2018  . Pancreatic mass 07/28/2018  . History of pulmonary embolus (PE) 07/28/2018  . History of DVT (deep vein thrombosis) 07/28/2018  . Tobacco use 07/28/2018    Past Surgical History:  Procedure Laterality Date  . CATARACT EXTRACTION, BILATERAL    . URETERAL STENT PLACEMENT Bilateral   . VARICOSE VEIN SURGERY Left         Home Medications    Prior to Admission medications   Medication Sig Start Date End Date Taking? Authorizing Provider  b complex vitamins capsule Take 1 capsule by mouth daily.   Yes [provider]  Cholecalciferol (VITAMIN D-3) 5000 units TABS Take 10,000 Units by mouth daily.    Yes [provider]  Omega-3 Fatty Acids (FISH OIL) 1200 MG CAPS Take 2 capsules by mouth daily.   Yes [provider]  warfarin (COUMADIN) 10 MG tablet Take 10 mg by mouth daily.   Yes [provider]    Family History Family History  Problem Relation Age of Onset  . Glaucoma Mother   . Lung cancer Father     Social History Social History   Tobacco Use  . Smoking status: Current Some Day Smoker  . Smokeless tobacco: Never Used  Substance Use Topics  . Alcohol use: Yes    Comment: ocassionally  . Drug use: Never     Allergies   Codeine   Review of Systems Review of Systems  Constitutional: Negative for appetite change and fatigue.  HENT: Negative for congestion, ear discharge and sinus pressure.        Headache and right jaw pain  Eyes: Negative for discharge.  Respiratory: Negative for cough.   Cardiovascular: Negative for chest pain.  Gastrointestinal: Negative for abdominal pain and diarrhea.  Genitourinary: Negative for frequency and hematuria.  Musculoskeletal: Negative for back pain.  Skin: Negative for rash.  Neurological: Negative for seizures and headaches.  Psychiatric/Behavioral: Negative for hallucinations.     Physical Exam Updated Vital Signs BP (!) 150/82   Pulse 84   Temp (!) 97.4 F (36.3 C) (Oral)   Resp Marland Kitchen)  25   Ht 6\' 8"  (2.032 m)   Wt (!) 172.4 kg   SpO2 94%   BMI 41.75 kg/m   Physical Exam  Constitutional: He is oriented to person, place, and time. He appears well-developed.  HENT:  Head: Normocephalic.  Tenderness to right jaw  Eyes: Conjunctivae and EOM are normal. No scleral icterus.  Neck: Neck supple. No thyromegaly present.  Cardiovascular: Normal rate and regular rhythm. Exam reveals no gallop and no friction rub.  No murmur heard. Pulmonary/Chest: No stridor. He has no wheezes. He has no rales. He exhibits no tenderness.  Abdominal: He exhibits no distension. There is no tenderness. There is  no rebound.  Musculoskeletal: Normal range of motion. He exhibits no edema.  Lymphadenopathy:    He has no cervical adenopathy.  Neurological: He is oriented to person, place, and time. He exhibits normal muscle tone. Coordination normal.  Skin: No rash noted. No erythema.  Psychiatric: He has a normal mood and affect. His behavior is normal.     ED Treatments / Results  Labs (all labs ordered are listed, but only abnormal results are displayed) Labs Reviewed  CBC WITH DIFFERENTIAL/PLATELET  COMPREHENSIVE METABOLIC PANEL  PROTIME-INR    EKG None  Radiology Ct Head Wo Contrast  Result Date: 10/12/2018 CLINICAL DATA:  Altered level of consciousness (LOC), unexplained; C-spine trauma, high clinical risk (NEXUS/CCR). Patient reports headache and right neck pain. EXAM: CT HEAD WITHOUT CONTRAST CT CERVICAL SPINE WITHOUT CONTRAST TECHNIQUE: Multidetector CT imaging of the head and cervical spine was performed following the standard protocol without intravenous contrast. Multiplanar CT image reconstructions of the cervical spine were also generated. COMPARISON:  Head CT and brain MRI 07/28/2018 FINDINGS: CT HEAD FINDINGS Brain: No intracranial hemorrhage, mass effect, or midline shift. No hydrocephalus. The basilar cisterns are patent. No evidence of territorial infarct or acute ischemia. No extra-axial or intracranial fluid collection. Vascular: No hyperdense vessel. Skull: No fracture or focal lesion. Sinuses/Orbits: Bilateral cataract resection.  No acute findings. Other: None. CT CERVICAL SPINE FINDINGS Mild motion artifact degradation. Alignment: Normal. Skull base and vertebrae: No acute fracture. Vertebral body heights are maintained. The dens and skull base are intact. Soft tissues and spinal canal: No prevertebral fluid or swelling. No visible canal hematoma. Disc levels: Mild endplate spurring with preservation of disc spaces. Mild scattered facet hypertrophy. Upper chest: Negative.  Other: Mild carotid calcifications. IMPRESSION: 1.  No acute intracranial abnormality. 2. No acute fracture or subluxation of the cervical spine allowing for mild motion degradation. Electronically Signed   By: Narda Rutherford M.D.   On: 10/12/2018 22:08   Ct Cervical Spine Wo Contrast  Result Date: 10/12/2018 CLINICAL DATA:  Altered level of consciousness (LOC), unexplained; C-spine trauma, high clinical risk (NEXUS/CCR). Patient reports headache and right neck pain. EXAM: CT HEAD WITHOUT CONTRAST CT CERVICAL SPINE WITHOUT CONTRAST TECHNIQUE: Multidetector CT imaging of the head and cervical spine was performed following the standard protocol without intravenous contrast. Multiplanar CT image reconstructions of the cervical spine were also generated. COMPARISON:  Head CT and brain MRI 07/28/2018 FINDINGS: CT HEAD FINDINGS Brain: No intracranial hemorrhage, mass effect, or midline shift. No hydrocephalus. The basilar cisterns are patent. No evidence of territorial infarct or acute ischemia. No extra-axial or intracranial fluid collection. Vascular: No hyperdense vessel. Skull: No fracture or focal lesion. Sinuses/Orbits: Bilateral cataract resection.  No acute findings. Other: None. CT CERVICAL SPINE FINDINGS Mild motion artifact degradation. Alignment: Normal. Skull base and vertebrae: No acute fracture. Vertebral body  heights are maintained. The dens and skull base are intact. Soft tissues and spinal canal: No prevertebral fluid or swelling. No visible canal hematoma. Disc levels: Mild endplate spurring with preservation of disc spaces. Mild scattered facet hypertrophy. Upper chest: Negative. Other: Mild carotid calcifications. IMPRESSION: 1.  No acute intracranial abnormality. 2. No acute fracture or subluxation of the cervical spine allowing for mild motion degradation. Electronically Signed   By: Narda RutherfordMelanie  Sanford M.D.   On: 10/12/2018 22:08    Procedures Procedures (including critical care  time)  Medications Ordered in ED Medications - No data to display   Initial Impression / Assessment and Plan / ED Course  I have reviewed the triage vital signs and the nursing notes.  Pertinent labs & imaging results that were available during my care of the patient were reviewed by me and considered in my medical decision making (see chart for details).     CT head and cervical spine unremarkable.  Patient will have CBC chemistries and PT done.  If labs are unremarkable he will be discharged home and treated for headache symptoms.  Final Clinical Impressions(s) / ED Diagnoses   Final diagnoses:  None    ED Discharge Orders    None       Bethann BerkshireZammit, Jontae Sonier, MD 10/12/18 2350

## 2018-10-12 NOTE — Discharge Instructions (Addendum)
Follow-up with your family doctor in a week for recheck of headache

## 2018-10-12 NOTE — ED Notes (Signed)
Patient transported to Radiology 

## 2018-10-13 LAB — CBC WITH DIFFERENTIAL/PLATELET
Abs Immature Granulocytes: 0.03 10*3/uL (ref 0.00–0.07)
Basophils Absolute: 0.1 10*3/uL (ref 0.0–0.1)
Basophils Relative: 1 %
EOS ABS: 0.3 10*3/uL (ref 0.0–0.5)
EOS PCT: 3 %
HEMATOCRIT: 52 % (ref 39.0–52.0)
Hemoglobin: 16.7 g/dL (ref 13.0–17.0)
Immature Granulocytes: 0 %
LYMPHS ABS: 2.8 10*3/uL (ref 0.7–4.0)
Lymphocytes Relative: 32 %
MCH: 29.5 pg (ref 26.0–34.0)
MCHC: 32.1 g/dL (ref 30.0–36.0)
MCV: 91.7 fL (ref 80.0–100.0)
MONOS PCT: 8 %
Monocytes Absolute: 0.7 10*3/uL (ref 0.1–1.0)
NRBC: 0 % (ref 0.0–0.2)
Neutro Abs: 4.9 10*3/uL (ref 1.7–7.7)
Neutrophils Relative %: 56 %
Platelets: 195 10*3/uL (ref 150–400)
RBC: 5.67 MIL/uL (ref 4.22–5.81)
RDW: 13 % (ref 11.5–15.5)
WBC: 8.7 10*3/uL (ref 4.0–10.5)

## 2018-10-13 LAB — COMPREHENSIVE METABOLIC PANEL
ALT: 22 U/L (ref 0–44)
ANION GAP: 6 (ref 5–15)
AST: 20 U/L (ref 15–41)
Albumin: 3.6 g/dL (ref 3.5–5.0)
Alkaline Phosphatase: 69 U/L (ref 38–126)
BILIRUBIN TOTAL: 0.5 mg/dL (ref 0.3–1.2)
BUN: 9 mg/dL (ref 6–20)
CO2: 25 mmol/L (ref 22–32)
Calcium: 8.9 mg/dL (ref 8.9–10.3)
Chloride: 108 mmol/L (ref 98–111)
Creatinine, Ser: 0.73 mg/dL (ref 0.61–1.24)
GFR calc Af Amer: >60 mL/min (ref >60)
Glucose, Bld: 110 mg/dL — ABNORMAL HIGH (ref 70–99)
POTASSIUM: 3.9 mmol/L (ref 3.5–5.1)
Sodium: 139 mmol/L (ref 135–145)
TOTAL PROTEIN: 7.5 g/dL (ref 6.5–8.1)

## 2018-10-13 LAB — PROTIME-INR
INR: 2.24
Prothrombin Time: 24.4 seconds — ABNORMAL HIGH (ref 11.4–15.2)

## 2018-10-13 NOTE — ED Provider Notes (Signed)
  Physical Exam  BP 123/70 (BP Location: Left Arm)   Pulse 62   Temp (!) 97.4 F (36.3 C) (Oral)   Resp 18   Ht 6\' 8"  (2.032 m)   Wt (!) 172.4 kg   SpO2 95%   BMI 41.75 kg/m   Physical Exam  ED Course/Procedures     Procedures  MDM   Assuming care from Dr. Estell HarpinZammit.  62 year old male comes in with chief complaint of headache.  He was seen recently in the hospital for similar complaint and had negative work-up.  Labs are pending.  CT scan of the head and C-spine are already negative.  If the labs are normal then patient can be discharged.       Derwood KaplanNanavati, Markia Kyer, MD 10/13/18 [phone]

## 2019-08-15 IMAGING — CT CT HEAD W/O CM
3 series · 16 of 47 positions shown, 19 images · non-contrast
Comparison: None.

CLINICAL DATA: Headache with slurred speech for 36 hours.

EXAM:
CT HEAD WITHOUT CONTRAST
TECHNIQUE: Contiguous axial images were obtained from the base of the skull
through the vertex without intravenous contrast.

[Series 2: head wo · axial · 0.44mm/px · z∈[+5,+145]mm · 10 of 34 slices shown, 13 images]
[im 3/34  brain]
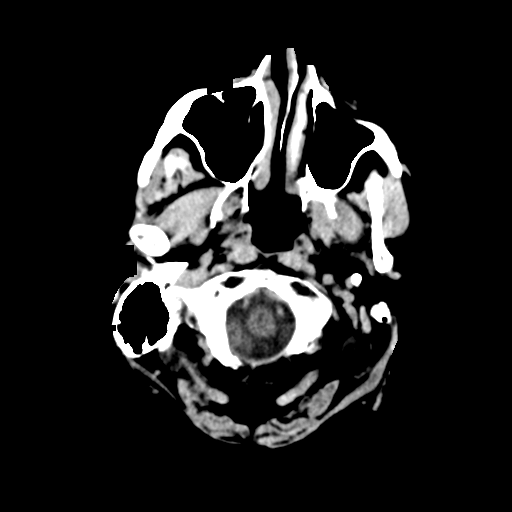
[im 3/34  bone]
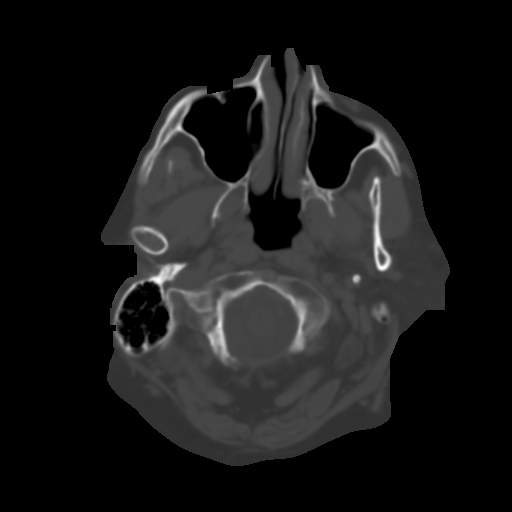
[im 6/34  brain]
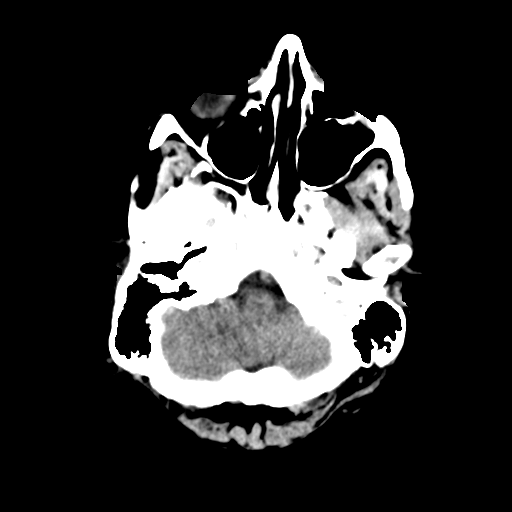
[im 10/34  brain]
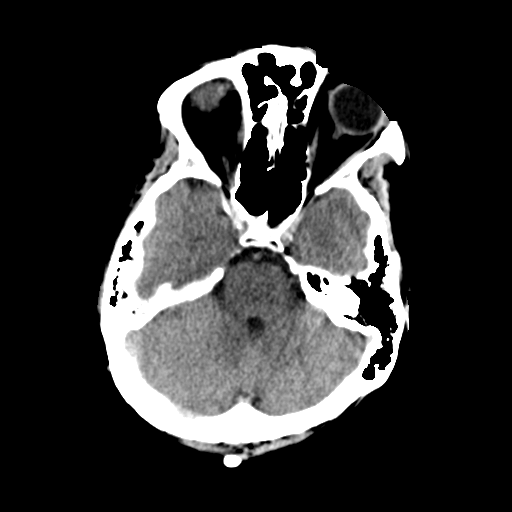
[im 12/34  brain]
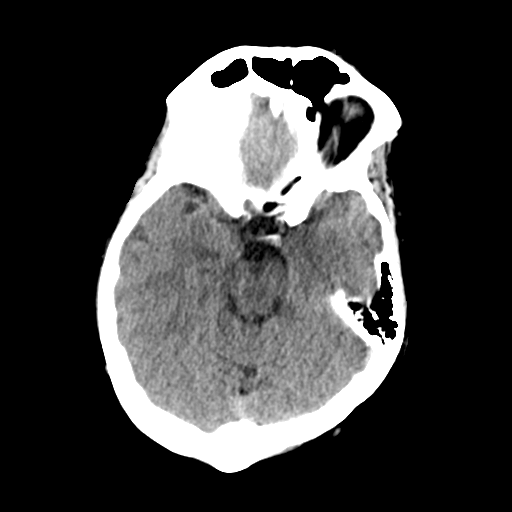
[im 15/34  brain]
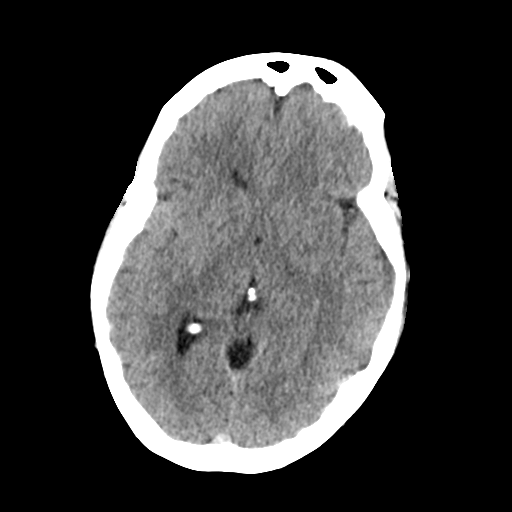
[im 15/34  bone]
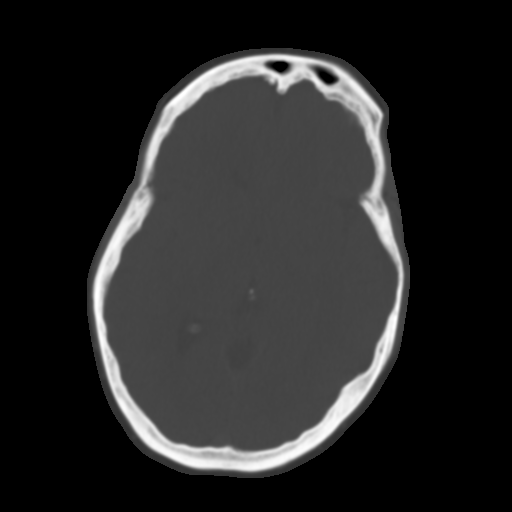
[im 19/34  brain]
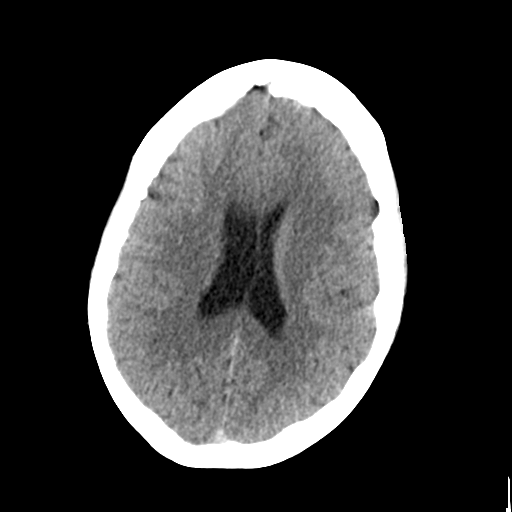
[im 22/34  brain]
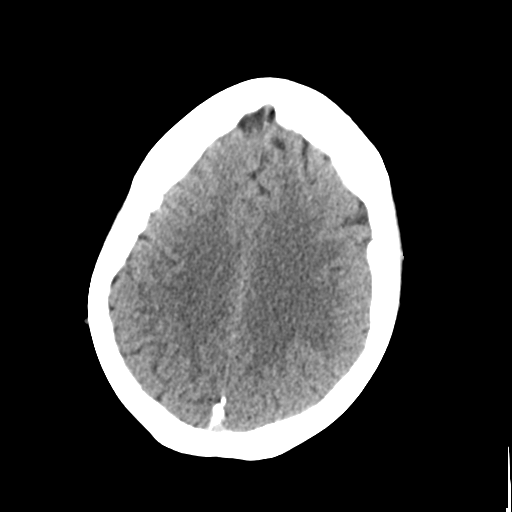
[im 26/34  brain]
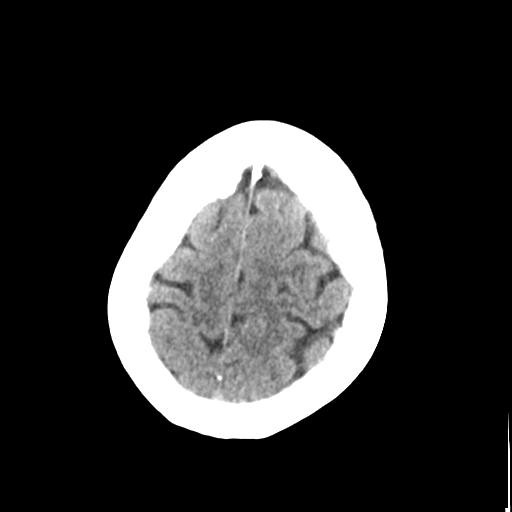
[im 28/34  brain]
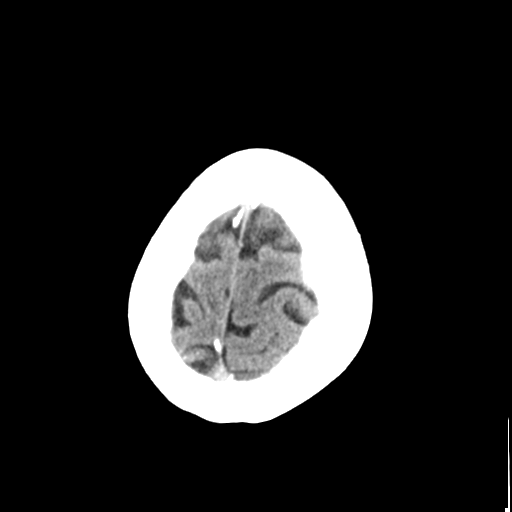
[im 28/34  bone]
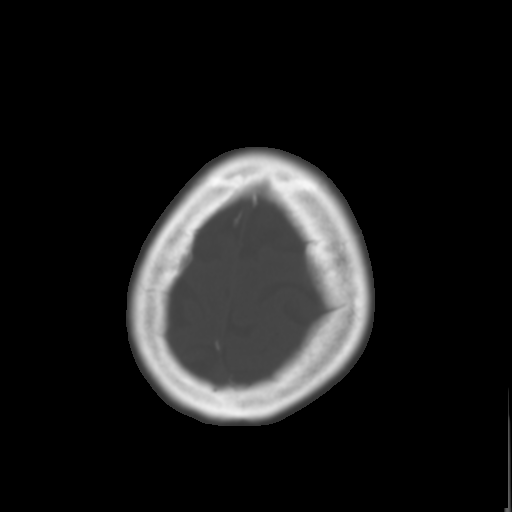
[im 31/34  brain]
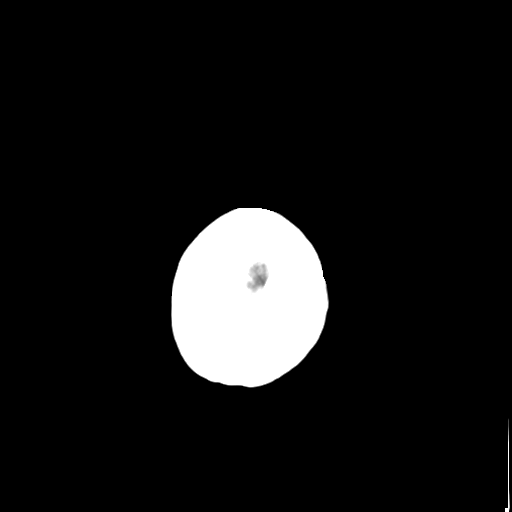

[Series 4: coronal soft tissue · coronal · 0.37mm/px · 3 of 73 slices shown]
[im 25/73  brain]
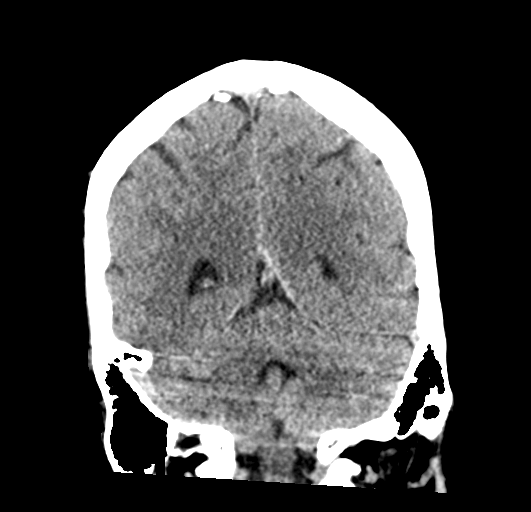
[im 33/73  brain]
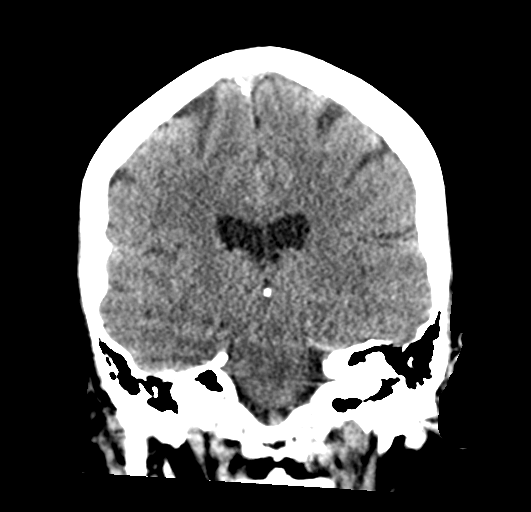
[im 41/73  brain]
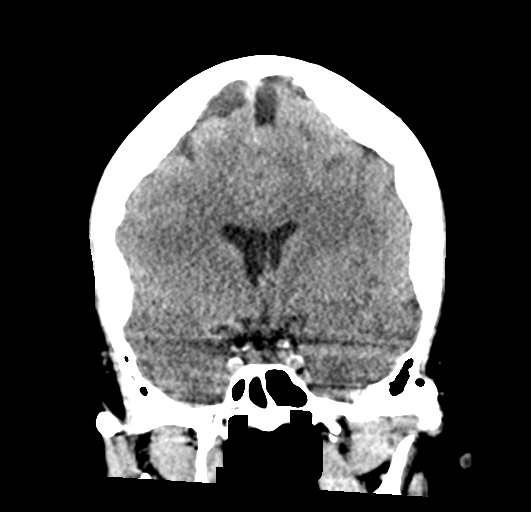

[Series 5: sagittal soft tissue · sagittal · 0.36mm/px · 3 of 60 slices shown]
[im 20/60  brain]
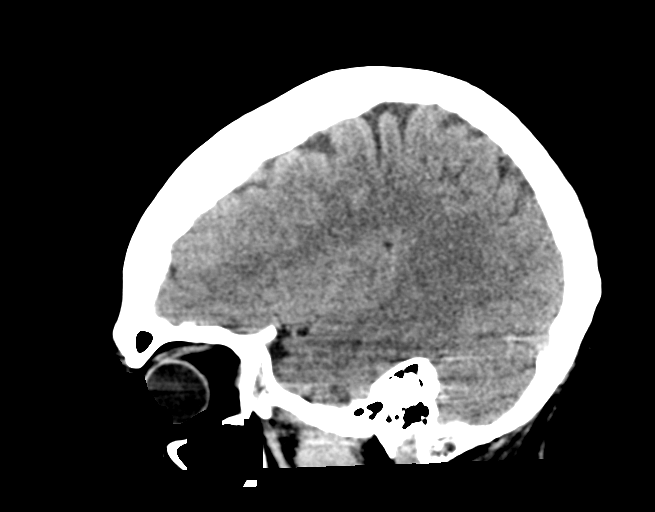
[im 30/60  brain]
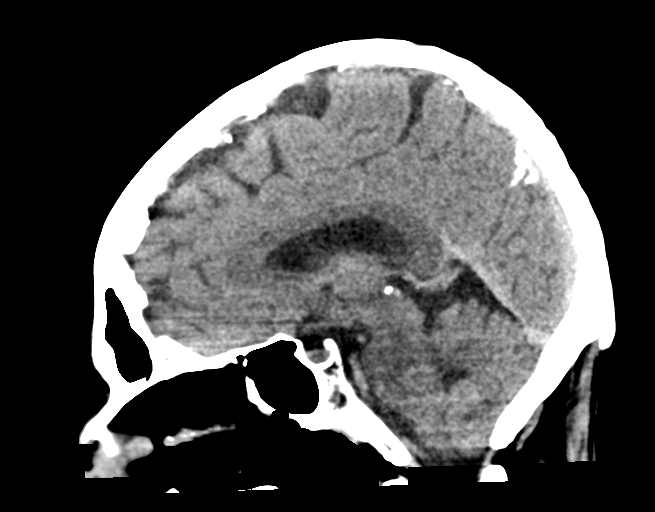
[im 40/60  brain]
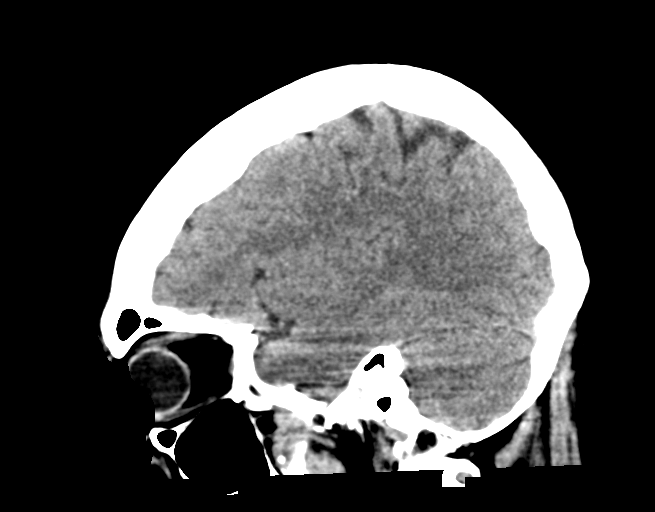

[16 of 47 positions shown; findings below may reference images not displayed]

FINDINGS: Brain: No intracranial hemorrhage, mass effect, or midline shift. No
hydrocephalus. The basilar cisterns are patent. No evidence of
territorial infarct or acute ischemia. No extra-axial or
intracranial fluid collection.

Vascular: No hyperdense vessel.

Skull: No fracture or focal lesion.

Sinuses/Orbits: No acute findings.  Bilateral cataract resection.

Other: None.
IMPRESSION: No acute intracranial abnormality.

## 2019-08-15 IMAGING — DX DG CHEST 2V
2 series · 2 of 2 positions shown · non-contrast
Comparison: None

CLINICAL DATA: Chest pain onset today while sitting in a chair,
headache and slurred speech since 4688 hours on [REDACTED]

EXAM:
CHEST - 2 VIEW

[chest pa]
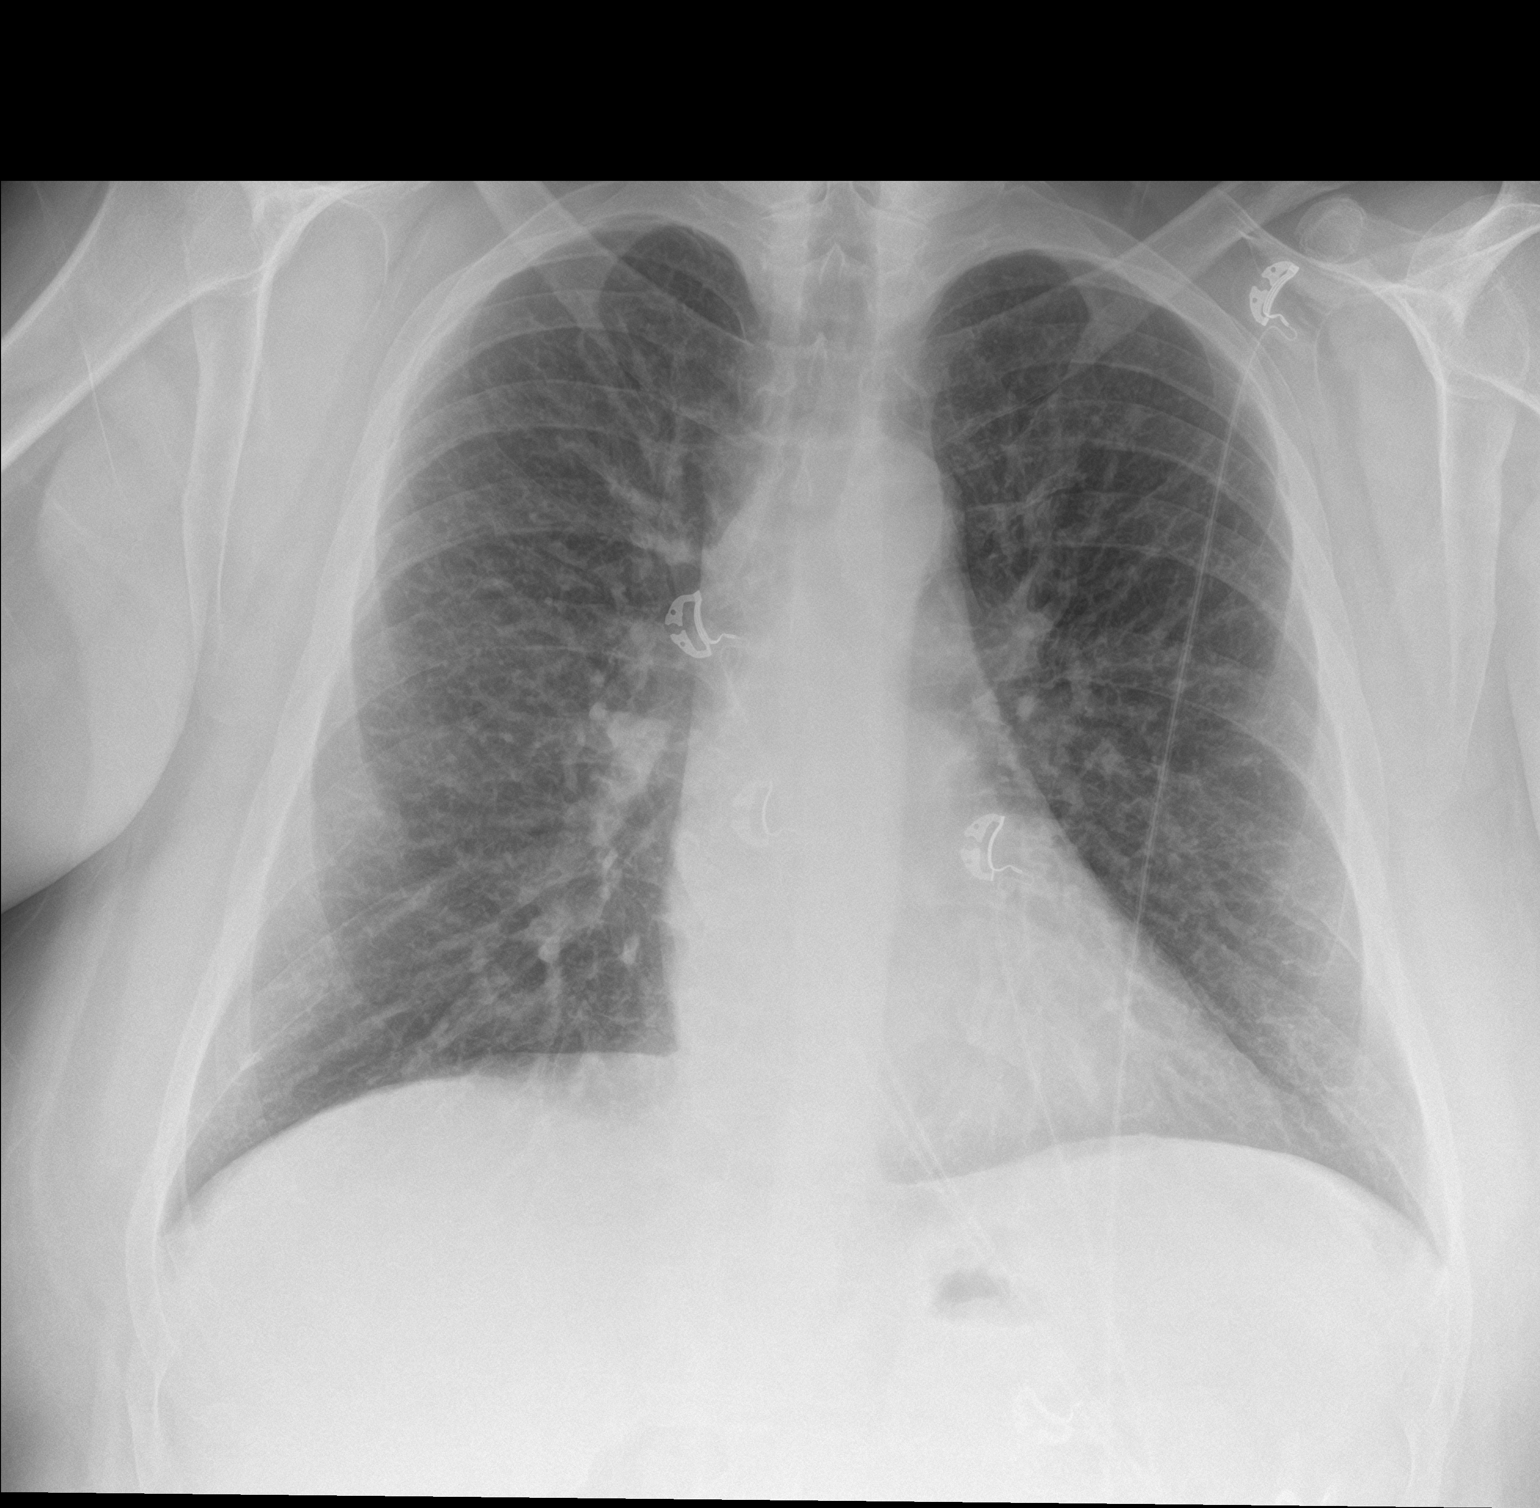

[chest lat]
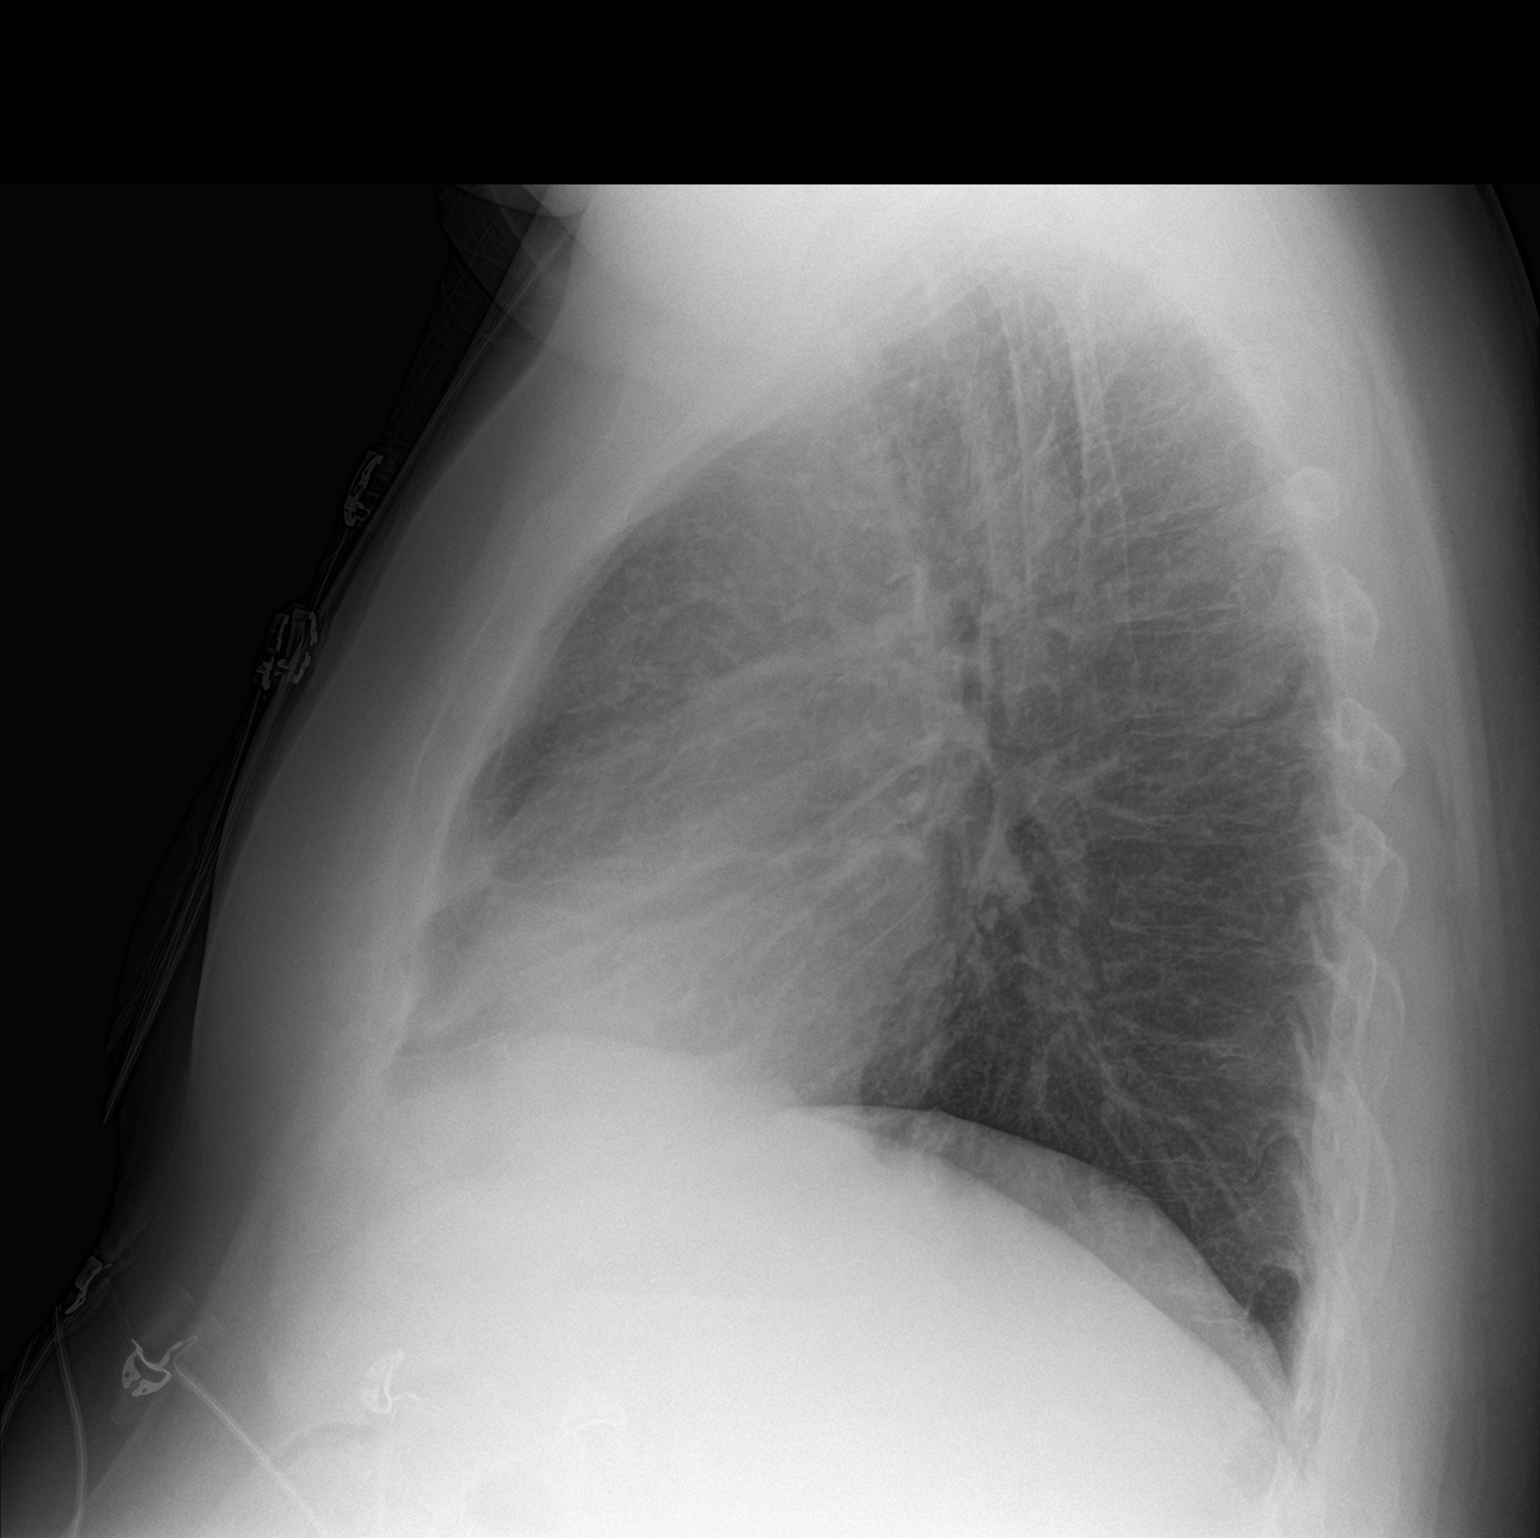

[2 of 2 positions shown; findings below may reference images not displayed]

FINDINGS: Normal heart size, mediastinal contours, and pulmonary vascularity.

Lungs clear.

No acute infiltrate, pleural effusion or pneumothorax.

Bones appear demineralized.
IMPRESSION: No acute abnormalities.

## 2021-01-18 ENCOUNTER — Emergency Department (HOSPITAL_COMMUNITY): Payer: Medicare PPO

## 2021-01-18 ENCOUNTER — Emergency Department (HOSPITAL_COMMUNITY)
Admission: EM | Admit: 2021-01-18 | Discharge: 2021-01-18 | Disposition: A | Discharge status: Home / Self Care | Payer: Medicare PPO | Attending: Emergency Medicine | Admitting: Emergency Medicine

## 2021-01-18 ENCOUNTER — Encounter (HOSPITAL_COMMUNITY): Payer: Self-pay

## 2021-01-18 ENCOUNTER — Other Ambulatory Visit: Payer: Self-pay

## 2021-01-18 DIAGNOSIS — S83241A Other tear of medial meniscus, current injury, right knee, initial encounter: Secondary | ICD-10-CM

## 2021-01-18 DIAGNOSIS — Y92000 Kitchen of unspecified non-institutional (private) residence as  the place of occurrence of the external cause: Secondary | ICD-10-CM | POA: Diagnosis not present

## 2021-01-18 DIAGNOSIS — S8991XA Unspecified injury of right lower leg, initial encounter: Secondary | ICD-10-CM | POA: Diagnosis present

## 2021-01-18 DIAGNOSIS — Z7901 Long term (current) use of anticoagulants: Secondary | ICD-10-CM | POA: Insufficient documentation

## 2021-01-18 DIAGNOSIS — W19XXXA Unspecified fall, initial encounter: Secondary | ICD-10-CM | POA: Diagnosis not present

## 2021-01-18 DIAGNOSIS — S83281A Other tear of lateral meniscus, current injury, right knee, initial encounter: Secondary | ICD-10-CM | POA: Diagnosis not present

## 2021-01-18 DIAGNOSIS — I4891 Unspecified atrial fibrillation: Secondary | ICD-10-CM | POA: Insufficient documentation

## 2021-01-18 DIAGNOSIS — F17200 Nicotine dependence, unspecified, uncomplicated: Secondary | ICD-10-CM | POA: Insufficient documentation

## 2021-01-18 DIAGNOSIS — S82101A Unspecified fracture of upper end of right tibia, initial encounter for closed fracture: Secondary | ICD-10-CM | POA: Insufficient documentation

## 2021-01-18 LAB — CBC WITH DIFFERENTIAL/PLATELET
Abs Immature Granulocytes: 0.04 10*3/uL (ref 0.00–0.07)
Basophils Absolute: 0.1 10*3/uL (ref 0.0–0.1)
Basophils Relative: 1 %
Eosinophils Absolute: 0.2 10*3/uL (ref 0.0–0.5)
Eosinophils Relative: 2 %
HCT: 50.5 % (ref 39.0–52.0)
Hemoglobin: 16 g/dL (ref 13.0–17.0)
Immature Granulocytes: 0 %
Lymphocytes Relative: 29 %
Lymphs Abs: 2.9 10*3/uL (ref 0.7–4.0)
MCH: 29.9 pg (ref 26.0–34.0)
MCHC: 31.7 g/dL (ref 30.0–36.0)
MCV: 94.4 fL (ref 80.0–100.0)
Monocytes Absolute: 1 10*3/uL (ref 0.1–1.0)
Monocytes Relative: 10 %
Neutro Abs: 5.8 10*3/uL (ref 1.7–7.7)
Neutrophils Relative %: 58 %
Platelets: 172 10*3/uL (ref 150–400)
RBC: 5.35 MIL/uL (ref 4.22–5.81)
RDW: 13.1 % (ref 11.5–15.5)
WBC: 10 10*3/uL (ref 4.0–10.5)
nRBC: 0 % (ref 0.0–0.2)

## 2021-01-18 LAB — BASIC METABOLIC PANEL
Anion gap: 10 (ref 5–15)
BUN: 11 mg/dL (ref 8–23)
CO2: 24 mmol/L (ref 22–32)
Calcium: 8.3 mg/dL — ABNORMAL LOW (ref 8.9–10.3)
Chloride: 104 mmol/L (ref 98–111)
Creatinine, Ser: 0.71 mg/dL (ref 0.61–1.24)
GFR, Estimated: >60 mL/min (ref >60)
Glucose, Bld: 107 mg/dL — ABNORMAL HIGH (ref 70–99)
Potassium: 4.1 mmol/L (ref 3.5–5.1)
Sodium: 138 mmol/L (ref 135–145)

## 2021-01-18 LAB — PROTIME-INR
INR: 1.9 — ABNORMAL HIGH (ref 0.8–1.2)
Prothrombin Time: 20.8 seconds — ABNORMAL HIGH (ref 11.4–15.2)

## 2021-01-18 MED ORDER — OXYCODONE-ACETAMINOPHEN 5-325 MG PO TABS: 1.0000 | ORAL_TABLET | ORAL | 0 refills | Status: DC | PRN

## 2021-01-18 MED ORDER — OXYCODONE-ACETAMINOPHEN 5-325 MG PO TABS
1.0000 | ORAL_TABLET | Freq: Once | ORAL | Status: AC
  Administered 2021-01-18: 1 via ORAL
  Filled 2021-01-18: qty 1

## 2021-01-18 MED ORDER — KETOROLAC TROMETHAMINE 60 MG/2ML IM SOLN
60.0000 mg | Freq: Once | INTRAMUSCULAR | Status: AC
  Administered 2021-01-18: 60 mg via INTRAMUSCULAR
  Filled 2021-01-18: qty 2

## 2021-01-18 NOTE — ED Notes (Signed)
Returned from xray

## 2021-01-18 NOTE — Discharge Instructions (Addendum)
The MRI of your knee today shows that you have incomplete fracture of your knee and tears to the meniscus of your knee as well.  Try to avoid bearing weight on your right leg.  Use your walker and keep the knee immobilizer in place.  Call Dr. Karlton Lemon office to arrange a follow-up appointment.  You may apply ice packs on and off to your knee as well.  We have checked your blood work today and your INR level was 1.9.  Follow-up with your primary doctor as your warfarin dosing may need adjustment.

## 2021-01-18 NOTE — ED Triage Notes (Signed)
Presents via EMS for complaints of fall this morning, complaining of right knee discomfort - unable to move Lives in private residence at home  On blood thinners

## 2021-01-18 NOTE — ED Provider Notes (Signed)
[facility] EMERGENCY DEPARTMENT Provider Note   CSN: [id number] Arrival date & time: 01/18/21  1212     History Chief Complaint  Patient presents with  . Knee Injury    Right     Roger Phelps is a 65 y.o. male.  HPI      Roger Phelps is a 65 y.o. male with past medical history of atrial fibrillation, DVT, PE, anticoagulated on warfarin who presents to the Emergency Department from home for evaluation of right knee pain and swelling secondary to a mechanical fall that occurred yesterday.  States that he was coming into his home from outside when he fell in the kitchen landing on the front of his right knee.  States that he had difficulty getting up from the floor and was having difficulty moving the right knee.  This morning, he endorses having increased pain of the right knee and is unable to bear any weight or bend the right knee.  Pain radiates into his ankle.  He denies head injury, LOC, neck pain, dizziness, headache nausea or vomiting.  Past Medical History:  Diagnosis Date  . Atrial fibrillation (HCC)   . DVT (deep venous thrombosis) (HCC)   . Pancreatic mass   . Pulmonary embolism (HCC)   . Renal disorder    kidney stones  . TIA (transient ischemic attack)   . Tobacco use     Patient Active Problem List   Diagnosis Date Noted  . 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 Atrial fibrillation (HCC) 07/28/2018  . Pancreatic mass 07/28/2018  . History of pulmonary embolus (PE) 07/28/2018  . History of DVT (deep vein thrombosis) 07/28/2018  . Tobacco use 07/28/2018    Past Surgical History:  Procedure Laterality Date  . CATARACT EXTRACTION, BILATERAL    . URETERAL STENT PLACEMENT Bilateral   . VARICOSE VEIN SURGERY Left        Family History  Problem Relation Age of Onset  . Glaucoma Mother   . Lung cancer Father     Social History   Tobacco Use  . Smoking status: Current Some Day Smoker  . Smokeless tobacco: Never Used   Substance Use Topics  . Alcohol use: Yes    Comment: ocassionally  . Drug use: Never    Home Medications Prior to Admission medications   Medication Sig Start Date End Date Taking? Authorizing Provider  b complex vitamins capsule Take 1 capsule by mouth daily.    [provider]  Cholecalciferol (VITAMIN D-3) 5000 units TABS Take 10,000 Units by mouth daily.    [provider]  Omega-3 Fatty Acids (FISH OIL) 1200 MG CAPS Take 2 capsules by mouth daily.    [provider]  traMADol (ULTRAM) 50 MG tablet Take 1 tablet (50 mg total) by mouth every 6 (six) hours as needed. 10/12/18   Bethann Berkshire, MD  warfarin (COUMADIN) 10 MG tablet Take 15 mg by mouth daily.    [provider]    Allergies    Codeine  Review of Systems   Review of Systems  Constitutional: Negative for chills and fever.  Eyes: Negative for visual disturbance.  Respiratory: Negative for shortness of breath.   Cardiovascular: Negative for chest pain and leg swelling.  Gastrointestinal: Negative for abdominal pain, nausea and vomiting.  Musculoskeletal: Positive for arthralgias (Right knee and right ankle pain) and joint swelling (Right knee swelling). Negative for back pain and neck pain.  Skin: Negative for color  change and wound.  Neurological: Negative for dizziness, syncope, weakness, numbness and headaches.  Psychiatric/Behavioral: Negative for confusion.    Physical Exam Updated Vital Signs BP 139/69 (BP Location: Left Arm)   Pulse 76   Temp 98.4 F (36.9 C) (Oral)   Resp 18   Ht 6\' 8"  (2.032 m)   Wt (!) 165.6 kg   SpO2 95%   BMI 40.10 kg/m   Physical Exam Vitals and nursing note reviewed.  Constitutional:      Appearance: Normal appearance. He is not ill-appearing.  HENT:     Head: Atraumatic.  Cardiovascular:     Rate and Rhythm: Normal rate and regular rhythm.     Pulses: Normal pulses.  Pulmonary:     Effort: Pulmonary effort is normal.  Chest:      Chest wall: No tenderness.  Abdominal:     Palpations: Abdomen is soft.     Tenderness: There is no abdominal tenderness.  Musculoskeletal:        General: Tenderness and signs of injury present. No deformity.     Cervical back: Normal range of motion. No tenderness.     Comments: Diffuse tenderness to palpation of the anterior right knee.  No palpable effusion.  Mild abrasion to the patellar region.  No bony deformity.  Compartments are soft.  Mild tenderness with range of motion of the right ankle without edema or bony deformity.  Skin:    General: Skin is warm.     Capillary Refill: Capillary refill takes less than 2 seconds.     Findings: Bruising present. No erythema.  Neurological:     General: No focal deficit present.     Mental Status: He is alert.     Sensory: No sensory deficit.     Motor: No weakness.     ED Results / Procedures / Treatments   Labs (all labs ordered are listed, but only abnormal results are displayed) Labs Reviewed  PROTIME-INR - Abnormal; Notable for the following components:      Result Value   Prothrombin Time 20.8 (*)    INR 1.9 (*)    All other components within normal limits  BASIC METABOLIC PANEL - Abnormal; Notable for the following components:   Glucose, Bld 107 (*)    Calcium 8.3 (*)    All other components within normal limits  CBC WITH DIFFERENTIAL/PLATELET    EKG None  Radiology DG Ankle Complete Right  Result Date: 01/18/2021 CLINICAL DATA:  Fall. EXAM: RIGHT ANKLE - COMPLETE 3+ VIEW COMPARISON:  No prior. FINDINGS: Diffuse soft tissue swelling. No acute bony abnormality. No evidence of fracture or dislocation. Focal small lucency noted in the distal right tibia. MRI of the distal right tibia suggested to further evaluate. IMPRESSION: 1. Diffuse soft tissue swelling. No acute bony abnormality identified. 2. Focal small lucency noted in the distal right tibia. MRI of the distal right tibia suggested to further evaluate. Electronically  Signed   By: Maisie Fushomas  Register   On: 01/18/2021 14:43   MR ANKLE RIGHT WO CONTRAST  Result Date: 01/18/2021 CLINICAL DATA:  Fall, pain EXAM: LEFT WRIST MRI TECHNIQUE: Multiplanar, multisequence MR imaging of the ankle was performed. No intravenous contrast was administered. COMPARISON:  Radiograph same day FINDINGS: TENDONS Peroneal: Peroneal longus tendon intact. Peroneal brevis intact. Posteromedial: Posterior tibial tendon intact. Flexor hallucis longus tendon intact. Flexor digitorum longus tendon intact. Anterior: Tibialis anterior tendon intact. Extensor hallucis longus tendon intact Extensor digitorum longus tendon intact. Achilles:  Intact.  Plantar Fascia: Intact. LIGAMENTS Lateral: Anterior talofibular ligament intact. Calcaneofibular ligament intact. Posterior talofibular ligament intact. Anterior and posterior tibiofibular ligaments intact. Medial: Deltoid ligament intact. Spring ligament intact. CARTILAGE Ankle Joint: No joint effusion. Normal ankle mortise. No chondral defect. Subtalar Joints/Sinus Tarsi: Normal subtalar joints. No subtalar joint effusion. Normal sinus tarsi. Bones: Mildly increased marrow signal seen at the posterior distal tibia. No osseous fracture however is noted. Soft Tissue: Subcutaneous edema is seen along the lateral aspect of the ankle. IMPRESSION: Fall reactive marrow/osseous contusion at the posterior distal tibia. No osseous fracture. Mild subcutaneous edema seen around the lateral malleolus. Electronically Signed   By: Jonna Clark M.D.   On: 01/18/2021 20:06   MR KNEE RIGHT WO CONTRAST  Result Date: 01/18/2021 CLINICAL DATA:  Fall knee pain EXAM: MRI OF THE RIGHT KNEE WITHOUT CONTRAST TECHNIQUE: Multiplanar, multisequence MR imaging of the knee was performed. No intravenous contrast was administered. COMPARISON:  None. FINDINGS: MENISCI Medial: There is undersurface fraying and blunting of the posterior horn of the medial meniscus with a possible nondisplaced tear.  Lateral: There is a horizontal longitudinal tear seen at the posterior horn mid body junction. However the root attachment is still intact. There is slight extrusion of the mid body. LIGAMENTS Cruciates: The ACL is intact. The PCL is intact. Collaterals: The MCL is intact. Intrasubstance signal seen within the fibular collateral ligament, however it is intact. There is also edema seen around the popliteus tendon. CARTILAGE Patellofemoral: Mild chondral fissuring seen within the lateral patellar facet. Medial compartment: Chondral fissuring seen the weight-bearing surface of the medial femoral condyle and chondral thinning in the medial tibial plateau. Lateral compartment: Chondral thinning seen the weight-bearing surface of the lateral femoral condyle. BONES: There is a nondisplaced incomplete fracture seen of the anterior proximal lateral tibia. Possible intra-articular extension is seen anteriorly. Surrounding marrow edema is noted. JOINT: A moderate knee joint effusion is present. EXTENSOR MECHANISM: The patellar and quadriceps tendon are intact. Increased signal seen around the lateral patellar retinaculum, however it is intact. POPLITEAL FOSSA: No popliteal cyst. OTHER:  Edema seen along the anterolateral aspect of the knee. IMPRESSION: Nondisplaced incomplete fracture of the anterior lateral proximal tibia with surrounding marrow edema. Nondisplaced tear of the posterior lateral meniscus mid body junction with slight extrusion. Blunting of the posterior medial meniscus with a probable nondisplaced under surface tear. 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 Electronically Signed   By: Jonna Clark M.D.   On: 01/18/2021 20:15   DG Knee Complete 4 Views Right  Result Date: 01/18/2021 CLINICAL DATA:  Fall, knee pain. EXAM:  RIGHT KNEE - COMPLETE 4+ VIEW COMPARISON:  None. FINDINGS: Osseous alignment is normal. No fracture line or displaced fracture fragment is seen. No appreciable joint effusion. Superficial soft tissues are unremarkable. IMPRESSION: Negative. Electronically Signed   By: Bary Richard M.D.   On: 01/18/2021 14:39    Procedures Procedures   Medications Ordered in ED Medications  oxyCODONE-acetaminophen (PERCOCET/ROXICET) 5-325 MG per tablet 1 tablet (has no administration in time range)    ED Course  I have reviewed the triage vital signs and the nursing notes.  Pertinent labs & imaging results that were available during my care of the patient were reviewed by me and considered in my medical decision making (see chart for details).  Clinical Course as of 01/18/21 1438  Wed Jan 18, 9033  [id number] 65 year old male here with severe right knee pain after a fall.  He has not been able to ambulate since falling and his landlord helped him back into a recliner.  Presents by EMS.  He is on blood thinners.  Getting x-rays.  Disposition per results of testing. [MB]    Clinical Course User Index [MB] Terrilee Files, MD   MDM Rules/Calculators/A&P                          Patient here for evaluation of right knee pain after mechanical fall that occurred yesterday.  Significant pain with passive range of motion of the knee.  No bony deformity. NV intact.    X-ray of the knee negative, radiologist recommending MRI of the ankle for further evaluation.  Clinically, I suspect internal derangement of the right knee so we will proceed with MRI of ankle and knee  MRI of the right knee shows incomplete fracture of the tibia with medial and lateral meniscus tears.  Patient does live at home alone, offered admission, but patient declines stating he prefers to be discharged home.  Will consult orthopedics    Discussed findings with orthopedics, Dr. Aretha Parrot, who recommends knee immobilizer and if patient is  prefering discharge home, he will see in office for follow-up.   Final Clinical Impression(s) / ED Diagnoses Final diagnoses:  Closed fracture of proximal end of right tibia, unspecified fracture morphology, initial encounter  Acute medial meniscus tear of right knee, initial encounter  Acute lateral meniscus tear of right knee, initial encounter    Rx / DC Orders ED Discharge Orders    None       Rosey Bath 01/18/21 2157    Terrilee Files, MD 01/19/21 [id number]

## 2021-01-20 MED FILL — Oxycodone w/ Acetaminophen Tab 5-325 MG: ORAL | Qty: 6 | Status: AC

## 2021-01-24 ENCOUNTER — Ambulatory Visit: Payer: Medicare PPO | Admitting: Orthopedic Surgery

## 2021-01-24 ENCOUNTER — Other Ambulatory Visit: Payer: Self-pay

## 2021-01-24 ENCOUNTER — Encounter: Payer: Self-pay | Admitting: Orthopedic Surgery

## 2021-01-24 ENCOUNTER — Ambulatory Visit: Payer: Medicare PPO

## 2021-01-24 VITALS — BP 125/69 | HR 81 | Ht >= 80 in | Wt 365.0 lb

## 2021-01-24 DIAGNOSIS — T148XXA Other injury of unspecified body region, initial encounter: Secondary | ICD-10-CM | POA: Diagnosis not present

## 2021-01-24 DIAGNOSIS — S8991XA Unspecified injury of right lower leg, initial encounter: Secondary | ICD-10-CM | POA: Diagnosis not present

## 2021-01-24 DIAGNOSIS — W19XXXA Unspecified fall, initial encounter: Secondary | ICD-10-CM | POA: Diagnosis not present

## 2021-01-24 MED ORDER — HYDROCODONE-ACETAMINOPHEN 5-325 MG PO TABS: 1.0000 | ORAL_TABLET | Freq: Four times a day (QID) | ORAL | 0 refills | Status: DC | PRN

## 2021-01-24 NOTE — Progress Notes (Signed)
New Patient Visit  Assessment: Roger Phelps is a 65 y.o. male with the following: Bone bruising, right proximal tibia  Plan: Roger Phelps continues to have pain over the anterior aspect of the proximal tibia after falling directly onto his right knee, approximately 1 week ago.  His pain has improved some.  He has been able to ambulate with the assistance of a walker.  Repeat radiographs obtained in clinic today demonstrates no evidence of a fracture or depression of the joint line.  On physical exam, he does continue to have bruising and tenderness over the anterior tibia.  The patient lives alone, and is concerned about stability when ambulating.  As such, he was fitted for an updated knee immobilizer, without the posterior bar, as this is been causing him difficulty.  Advised him to continue working on range of motion when not ambulating.  I would like for him to use the walker to continue ambulating, and have the knee immobilizer on, with his leg in full extension when ambulating.  All questions were answered and he is in agreement with this plan.  Follow-up in 4 weeks for repeat evaluation.   Follow-up: Return in about 4 weeks (around 02/21/2021).  Subjective:  Chief Complaint  Patient presents with  . Fracture    Rt tib fracture after falling on Rt knee DOI 01/17/21    History of Present Illness: Roger Phelps is a 65 y.o. male who presents for evaluation of right knee pain.  Approximately 1 week ago, he fell, directly onto the anterior aspect of his right knee.  He had significant pain at that time.  EMS brought him to the ED for evaluation.  X-rays were negative, but the MRI demonstrated some bony edema within the proximal and anterior tibia.  These were also read as incomplete fractures.  Since then, his pain has improved.  He is continue to take Norco as needed.  He has used the knee immobilizer provided for him, at all times.  He notes some difficulty with ambulating, but has been using a  walker.  Also, when getting up from his seated position, he notes that the knee immobilizer causes some discomfort in the posterior aspect of his knee.  Prior to sustaining this fall he did not have any pain in his right knee.   Review of Systems: No fevers or chills No numbness or tingling No chest pain No shortness of breath No bowel or bladder dysfunction No GI distress No headaches   Medical History:  Past Medical History:  Diagnosis Date  . Atrial fibrillation (HCC)   . DVT (deep venous thrombosis) (HCC)   . Pancreatic mass   . Pulmonary embolism (HCC)   . Renal disorder    kidney stones  . TIA (transient ischemic attack)   . Tobacco use     Past Surgical History:  Procedure Laterality Date  . CATARACT EXTRACTION, BILATERAL    . URETERAL STENT PLACEMENT Bilateral   . VARICOSE VEIN SURGERY Left     Family History  Problem Relation Age of Onset  . Glaucoma Mother   . Lung cancer Father    Social History   Tobacco Use  . Smoking status: Current Some Day Smoker  . Smokeless tobacco: Never Used  Substance Use Topics  . Alcohol use: Yes    Comment: ocassionally  . Drug use: Never    Allergies  Allergen Reactions  . Codeine Nausea Only    Current Meds  Medication Sig  .  albuterol (VENTOLIN HFA) 108 (90 Base) MCG/ACT inhaler  See Instructions, 2 puffs inhale every 3 hours as needed for shortness of breath use with spacer chamber, # 1 EA, 3 Refill(s), Pharmacy: [facility] [id number], 200, cm, 07/03/19 11:55:00 EDT, Height/Length Measured  . Ascorbic Acid (VITAMIN C PO) Take by mouth.  . Cholecalciferol (VITAMIN D-3) 5000 units TABS Take 10,000 Units by mouth daily.  Marland Kitchen HYDROcodone-acetaminophen (NORCO/VICODIN) 5-325 MG tablet Take 1 tablet by mouth every 6 (six) hours as needed for severe pain.  Marland Kitchen oxyCODONE-acetaminophen (PERCOCET/ROXICET) 5-325 MG tablet Take 1 tablet by mouth every 4 (four) hours as needed.  . warfarin (COUMADIN) 10 MG tablet  Take 15 mg by mouth daily.    Objective: BP 125/69   Pulse 81   Ht 6\' 8"  (2.032 m)   Wt (!) 365 lb (165.6 kg)   BMI 40.10 kg/m   Physical Exam:  General: Alert and oriented, no acute distress Gait: Uses a walker, and a knee immobilizer to assist with ambulation.  Evaluation of the right knee demonstrates diffuse swelling.  He has ecchymosis over the anterior aspect of his knee.  The anterior, proximal tibia is tender to palpation, particularly over the anterior medial aspect.  The knee is stable to varus and valgus stress.  Negative Lachman.  He does have some dry skin distally, as well as noticeable edema.  IMAGING: I personally ordered and reviewed the following images  X-ray of the right knee was obtained and demonstrates mild varus alignment.  Loss of joint space within the medial compartment.  Overall, the joint line remains intact.  There is no obvious fracture lines.  There is no evidence of recent bony injury.  There is no joint line depression.  Impression: Right knee, mild degenerative changes without acute injury.  New Medications:  Meds ordered this encounter  Medications  . HYDROcodone-acetaminophen (NORCO/VICODIN) 5-325 MG tablet    Sig: Take 1 tablet by mouth every 6 (six) hours as needed for severe pain.    Dispense:  20 tablet    Refill:  0      , MD  01/24/2021 10:56 PM

## 2021-02-16 ENCOUNTER — Telehealth: Payer: Self-pay | Admitting: Orthopedic Surgery

## 2021-02-16 NOTE — Telephone Encounter (Signed)
Patient called to confirm appointment for Tuesday, 02/21/21, and to relay that "he can hardly walk"; asking if he can be seen sooner?  Please advise.

## 2021-02-16 NOTE — Telephone Encounter (Signed)
I called the patient and he voices understanding. He has a sooner appointment.

## 2021-02-17 ENCOUNTER — Ambulatory Visit: Payer: Medicare PPO

## 2021-02-17 ENCOUNTER — Encounter: Payer: Self-pay | Admitting: Orthopedic Surgery

## 2021-02-17 ENCOUNTER — Ambulatory Visit (INDEPENDENT_AMBULATORY_CARE_PROVIDER_SITE_OTHER): Payer: Medicare PPO | Admitting: Orthopedic Surgery

## 2021-02-17 ENCOUNTER — Other Ambulatory Visit: Payer: Self-pay

## 2021-02-17 ENCOUNTER — Other Ambulatory Visit: Payer: Self-pay | Admitting: Orthopedic Surgery

## 2021-02-17 VITALS — Ht >= 80 in | Wt 365.0 lb

## 2021-02-17 DIAGNOSIS — S8991XD Unspecified injury of right lower leg, subsequent encounter: Secondary | ICD-10-CM

## 2021-02-17 DIAGNOSIS — M1711 Unilateral primary osteoarthritis, right knee: Secondary | ICD-10-CM

## 2021-02-17 MED ORDER — TRAMADOL HCL 50 MG PO TABS: 50.0000 mg | ORAL_TABLET | Freq: Four times a day (QID) | ORAL | 5 refills | Status: DC | PRN

## 2021-02-17 NOTE — Progress Notes (Addendum)
Orthopaedic Clinic Return  Assessment: Roger Phelps is a 65 y.o. male with the following: Knee arthritis; acute worsening due to fall   Plan: Reviewed updated radiographs with the patient and his sister in clinic today for his right knee.  Specifically within the medial compartment, he has moderate to severe joint space narrowing and degenerative changes.  This is consistent with the MRI of his right knee.  The MRI also demonstrates a small, undersurface tear of the posterior horn of the medial meniscus, as well as a more extensive tear of the posterior horn of the lateral meniscus.  At this time, his pain is diffuse.  As a result, I feel that his current symptoms are primarily related to degenerative changes throughout the knee.  I recommended a steroid injection at this time, and he elected to proceed.  He should stop using the knee immobilizer as soon as he can tolerate this.  If he continues to have pain in the right knee, we could consider a right knee arthroscopy.  However, due to his size, extent of degenerative changes within his knee and his history of blood clots he is a high risk surgical candidate, with an increased probability of him having a poor outcome from knee arthroscopy.  We discussed this in great detail and I have answered all their questions.  Procedure note injection Right knee joint   Verbal consent was obtained to inject the right knee joint  Timeout was completed to confirm the site of injection.  The skin was prepped with alcohol and ethyl chloride was sprayed at the injection site.  A 21-gauge needle was used to inject 6 mg betamethasone and 1% lidocaine (3 cc) into the right knee using an anterolateral approach.  There were no complications. A sterile bandage was applied.    Meds ordered this encounter  Medications  . traMADol (ULTRAM) 50 MG tablet    Sig: Take 1 tablet (50 mg total) by mouth every 6 (six) hours as needed.    Dispense:  30 tablet    Refill:  5     Body mass index is 40.1 kg/m.  Follow-up: Return if symptoms worsen or fail to improve.   Subjective:  Chief Complaint  Patient presents with  . Knee Injury    Rt knee injury DOI 01/17/21    History of Present Illness: Roger Phelps is a 65 y.o. male who turns early to clinic for repeat evaluation of his right knee.  He was seen in clinic approximately 3 weeks ago.  He was improving until approximately 1 week ago.  Since then, his knee pain has worsened.  He continues to use a walker to assist with ambulation.  He is also wearing the knee immobilizer at all times.  No new injuries, but states the pain is diffuse throughout the knee.  He denies mechanical symptoms including locking, catching or popping.  Review of Systems: No fevers or chills No numbness or tingling No chest pain No shortness of breath No bowel or bladder dysfunction No GI distress No headaches   Objective: Ht 6\' 8"  (2.032 m)   Wt (!) 365 lb (165.6 kg)   BMI 40.10 kg/m   Physical Exam:  Large male, no acute distress.  Ambulates with the assistance of a walker.  Evaluation of the right knee demonstrates a mild effusion.  Range of motion from 10 to 110 degrees.  Discomfort at the extremes of range of motion testing.  Exquisite tenderness to palpation along the  medial joint line.  Some mild tenderness to palpation in the posterior lateral aspect of his knee.  Negative Lachman.  There is some swelling distally in his right lower extremity.  He is currently using a compression stockinette.  IMAGING: I personally ordered and reviewed the following images:   X-ray of the right knee was obtained in clinic today and demonstrates no acute injury.  There is near complete loss of joint space within the medial compartment.  Maintenance of joint space within the lateral compartment.  He does not have significant osteophytes forming.  Impression: Moderate to severe right knee arthritis, most severe within the medial  compartment.   Oliver Barre, MD 02/17/2021 12:12 PM

## 2021-02-17 NOTE — Patient Instructions (Signed)

## 2021-02-21 ENCOUNTER — Ambulatory Visit: Payer: Medicare PPO | Admitting: Orthopedic Surgery

## 2021-04-04 ENCOUNTER — Ambulatory Visit: Payer: Medicare PPO | Admitting: Orthopedic Surgery

## 2021-04-11 ENCOUNTER — Ambulatory Visit: Payer: Medicare PPO | Admitting: Orthopedic Surgery

## 2021-04-12 ENCOUNTER — Ambulatory Visit: Payer: Medicare PPO | Admitting: Orthopedic Surgery

## 2021-04-18 ENCOUNTER — Telehealth: Payer: Self-pay | Admitting: Orthopedic Surgery

## 2021-04-18 ENCOUNTER — Ambulatory Visit: Payer: Medicare PPO | Admitting: Orthopedic Surgery

## 2021-04-18 NOTE — Telephone Encounter (Signed)
Message received from Dr Radene Knee: "Due to pt's no-shows and cancellations. Dr. Dallas Schimke does not want this patient to be scheduled with him anymore."  NOTED (comment added to last re-scheduled appointment)

## 2021-06-20 ENCOUNTER — Telehealth: Payer: Self-pay | Admitting: Orthopedic Surgery

## 2021-06-20 DIAGNOSIS — S8991XD Unspecified injury of right lower leg, subsequent encounter: Secondary | ICD-10-CM

## 2021-06-20 NOTE — Telephone Encounter (Signed)
Patient called back today requesting to reschedule appointment for right knee. States he may need an injection; said feels a little worse, as if it wanted to give way yesterday.  Based on multiple cancellations and 2 no shows, please advise.

## 2021-06-30 NOTE — Telephone Encounter (Signed)
Patient called and left voicemail stating shots are not doing no good.  He want's surgery he doesn't care what the risk are he can't walk.    Please call the patient back and advise.

## 2021-06-30 NOTE — Telephone Encounter (Signed)
Spoke with pt who states he can barely walk due to right knee pain. States he received an injection in his knee on 06/29/21, by Dr. Manson Passey. Pt is currently on blood thinners, and INR is monitored by PCP. Please advise on next steps for pt.

## 2021-07-05 NOTE — Telephone Encounter (Signed)
Patient called and asked [place] to go ahead and send his referral to [facility] and all his records.

## 2021-07-05 NOTE — Telephone Encounter (Signed)
After speaking with Dr. Dallas Schimke I called pt and let him know that Dr. Dallas Schimke recommends he gets a referral to [facility], due to pt's size and medical history he does not feel he can get the best results from our facility. Pt asked if he could be sent to [facility] in [place], [place]. I let pt know that we will send a referral wherever he would like and that the recommendation is what Dr. Dallas Schimke suggested. Pt states he would like to discuss with his sister before making a decision and will call us back with decision.

## 2021-07-05 NOTE — Telephone Encounter (Signed)
Patient called back to follow up. Again he is asking for an appointment.  States he has not heard anything back from his primary care Dr Manson Passey either. Still relays cannot walk and wants to know what he is to do. Please advise if any notes or if we are to request report from patient's visit at primary care office?

## 2021-07-07 NOTE — Telephone Encounter (Signed)
Patient called and wants to know if his referral has been sent yet, and who was it sent to and there phone number so he can contact them   Please call him back

## 2021-07-10 NOTE — Telephone Encounter (Signed)
I tried to call the referred to office, NA, rang and rang when I hit option for physician office calling.  Will f/u on this again tomorrow and try to reach them, so we can update patient.

## 2021-07-10 NOTE — Telephone Encounter (Signed)
Patient sister Roger Phelps has called today and wants to know what the update is on her brother.  I advised her we can't talk with her she is not on his DPR.    She said her brother is getting anxious and is afraid he is going to fall.    They don't know what they are suppose to be doing, because the last conversation he had with the nurse was they were going to refer him to someone else and haven't heard back since.   They want to know if his records have been sent and where did they get sent to and who is the referring doctor that they were sent to.  He is afraid he is going to fall and he lives by himself.

## 2021-07-11 NOTE — Telephone Encounter (Signed)
Checked Care Everywhere and referral has been received, LVM to let pt know that they have received referral and # given for pt to call and schedule through them.

## 2021-08-29 ENCOUNTER — Telehealth: Payer: Self-pay | Admitting: Nutrition

## 2021-08-29 NOTE — Telephone Encounter (Signed)
VM left to call and schedule appt. 

## 2021-09-25 ENCOUNTER — Encounter: Payer: Self-pay | Admitting: Nutrition

## 2021-09-25 ENCOUNTER — Encounter: Payer: Medicare PPO | Attending: Family Medicine | Admitting: Nutrition

## 2021-09-25 VITALS — Ht >= 80 in | Wt 356.0 lb

## 2021-09-25 DIAGNOSIS — Z6839 Body mass index (BMI) 39.0-39.9, adult: Secondary | ICD-10-CM | POA: Insufficient documentation

## 2021-09-25 DIAGNOSIS — R7303 Prediabetes: Secondary | ICD-10-CM | POA: Insufficient documentation

## 2021-09-25 DIAGNOSIS — K8689 Other specified diseases of pancreas: Secondary | ICD-10-CM | POA: Insufficient documentation

## 2021-09-25 DIAGNOSIS — I10 Essential (primary) hypertension: Secondary | ICD-10-CM | POA: Insufficient documentation

## 2021-09-25 NOTE — Progress Notes (Signed)
Medical Nutrition Therapy  Appointment Start time:  1530  Appointment End time:  1630  Primary concerns today: Obesity  Referral diagnosis: E66.01 Preferred learning style: No preference  Learning readiness: Ready   NUTRITION ASSESSMENT  65 yr old male with obesity with BMI 39. Wants to lose weight and prevent diabetes. Currently in a wheel chair due to knee issues. Only eats 1 meal per day.  Anthropometrics  Wt Readings from Last 3 Encounters:  09/25/21 (!) 356 lb (161.5 kg)  02/17/21 (!) 365 lb (165.6 kg)  01/24/21 (!) 365 lb (165.6 kg)   Ht Readings from Last 3 Encounters:  09/25/21 6\' 8"  (2.032 m)  02/17/21 6\' 8"  (2.032 m)  01/24/21 6\' 8"  (2.032 m)   Body mass index is 39.11 kg/m. @BMIFA @ Facility age limit for growth percentiles is 20 years. Facility age limit for growth percentiles is 20 years.   CMP Latest Ref Rng & Units 01/18/2021 10/12/2018 07/28/2018  Glucose 70 - 99 mg/dL ) 03/20/2021) 10/14/2018)  BUN 8 - 23 mg/dL 11 9 11   Creatinine 0.61 - 1.24 mg/dL 09/27/2018 096(G 836(O  Sodium 135 - 145 mmol/L 138 139 139  Potassium 3.5 - 5.1 mmol/L 4.1 3.9 3.6  Chloride 98 - 111 mmol/L 104 108 106  CO2 22 - 32 mmol/L 24 25 23   Calcium 8.9 - 10.3 mg/dL 8.3(L) 8.9 9.0  Total Protein 6.5 - 8.1 g/dL - 7.5 7.5  Total Bilirubin 0.3 - 1.2 mg/dL - 0.5 0.7  Alkaline Phos 38 - 126 U/L - 69 77  AST 15 - 41 U/L - 20 28  ALT 0 - 44 U/L - 22 28   Lipid Panel     Component Value Date/Time   CHOL 121 07/28/2018 0537   TRIG 80 07/28/2018 0537   HDL 22 (L) 07/28/2018 0537   CHOLHDL 5.5 07/28/2018 0537   VLDL 16 07/28/2018 0537   LDLCALC 83 07/28/2018 0537     Clinical Medical Hx: Afib, TIA, Pancreatic mass, HTN, VIt D deficiency Medications: see chart Labs:  Lab Results  Component Value Date   HGBA1C 5.8 (H) 07/28/2018    Notable Signs/Symptoms: Knee pain right leg. In a wheelchair  Lifestyle & Dietary Hx Lives by himself.  He cooks his meals  Estimated daily fluid intake: 16  oz Supplements: none Sleep: varies Stress / self-care: knee problem Current average weekly physical activity: none  24-Hr Dietary Recall First Meal Snack:  Second Meal:  Snack:  Third Meal: 09/27/2018 polish sausage, onion, tomatoes and 1 potato, garlic, Diet Soda Snack: Jello SF Beverages: Diet sodas, milk-2% , water, juice, adkins.  Estimated Energy Needs Calories: 1500 Carbohydrate: 170g Protein: 112g Fat: 42g   NUTRITION DIAGNOSIS  [place]-3.3 Overweight/obesity As related to High calorie processed food diet.  As evidenced by BMI 39.   NUTRITION INTERVENTION  Nutrition education (E-1) on the following topics:  Nutrition and  Pre- Diabetes education provided on My Plate, CHO counting, meal planning, portion sizes, timing of meals, avoiding snacks between meals , taking medications as prescribed, benefits of exercising 30 minutes per day and prevention of DM.   Lifestyle Medicine - Whole Food, Plant Predominant Nutrition is highly recommended: Eat Plenty of vegetables, Mushrooms, fruits, Legumes, Whole Grains, Nuts, seeds in lieu of processed meats, processed snacks/pastries red meat, poultry, eggs.    -It is better to avoid simple carbohydrates including: Cakes, Sweet Desserts, Ice Cream, Soda (diet and regular), Sweet Tea, Candies, Chips, Cookies, Store Bought Juices, Alcohol in Excess of  1-2 drinks a day, Lemonade,  Artificial Sweeteners, Doughnuts, Coffee Creamers, "Sugar-free" Products, etc, etc.  This is not a complete list.....  Exercise: If you are able: 30 -60 minutes a day ,4 days a week, or 150 minutes a week.  The longer the better.  Combine stretch, strength, and aerobic activities.  If you were told in the past that you have high risk for cardiovascular diseases, you may seek evaluation by your heart doctor prior to initiating moderate to intense exercise programs.  Handouts Provided Include  Lifestyle medicine My Plate Meal Plan Card   Learning Style & Readiness  for Change Teaching method utilized: Visual & Auditory  Demonstrated degree of understanding via: Teach Back  Barriers to learning/adherence to lifestyle change: limited mobility  Goals Established by Pt   Eat three meals per day on time. Focus on more plant based foods Cut out eating after 7 pm. Drink a gallon of water per day Try doing chair exercises daily. Diet out diet sodas   Lifestyle Medicine - Whole Food, Plant Predominant Nutrition is highly recommended: Eat Plenty of vegetables, Mushrooms, fruits, Legumes, Whole Grains, Nuts, seeds in lieu of processed meats, processed snacks/pastries red meat, poultry, eggs.    -It is better to avoid simple carbohydrates including: Cakes, Sweet Desserts, Ice Cream, Soda (diet and regular), Sweet Tea, Candies, Chips, Cookies, Store Bought Juices, Alcohol in Excess of  1-2 drinks a day, Lemonade,  Artificial Sweeteners, Doughnuts, Coffee Creamers, "Sugar-free" Products, etc, etc.  This is not a complete list.....  Exercise: If you are able: 30 -60 minutes a day ,4 days a week, or 150 minutes a week.  The longer the better.  Combine stretch, strength, and aerobic activities.  If you were told in the past that you have high risk for cardiovascular diseases, you may seek evaluation by your heart doctor prior to initiating moderate to intense exercise programs.   MONITORING & EVALUATION Dietary intake, weekly physical activity, and food journal and weight in 1 month.  Next Steps  Patient is to work on meal planning, cutting out processed foods and doing chair exercises.Marland Kitchen

## 2021-09-25 NOTE — Patient Instructions (Signed)
Goal Eat three meals per day on time. Focus on more plant based foods Cut out eating after 7 pm. Drink a gallon of water per day Try doing chair exercises daily. Diet out diet sodas

## 2021-10-19 ENCOUNTER — Encounter: Payer: Self-pay | Admitting: Nutrition

## 2021-10-26 ENCOUNTER — Ambulatory Visit: Payer: Medicare PPO | Admitting: Nutrition

## 2021-11-19 HISTORY — PX: REPLACEMENT TOTAL KNEE: SUR1224

## 2021-11-23 ENCOUNTER — Telehealth: Payer: Self-pay | Admitting: Nutrition

## 2021-11-23 NOTE — Telephone Encounter (Signed)
Vm to call and r/s canceld appt from December. PC

## 2022-08-30 ENCOUNTER — Other Ambulatory Visit (HOSPITAL_COMMUNITY): Payer: Self-pay | Admitting: Sports Medicine

## 2022-08-30 DIAGNOSIS — M5440 Lumbago with sciatica, unspecified side: Secondary | ICD-10-CM

## 2023-01-17 ENCOUNTER — Encounter: Payer: Self-pay | Admitting: Radiology

## 2023-03-04 NOTE — Progress Notes (Addendum)
GI Office Note    Referring Provider: Wilmon Pali, FNP Primary Care Physician:  Wilmon Pali, FNP  Primary Gastroenterologist:Michael Jena Gauss, MD   Chief Complaint   Chief Complaint  Patient presents with   Diarrhea    Intermittent diarrhea since first of Dec.     History of Present Illness   Roger Phelps is a 67 y.o. male presenting today at the request of Coral Ceo, FNP for chronic diarrhea and need for colonoscopy.  Patient states that symptoms started back in December.  He had started new medication for osteoporosis by mouth on a weekly basis, took 2-3 doses.  Noticed he was getting sick, ultimately had to stop the medication.  It was around this time that he noticed change in his bowel movements.  Denied any antibiotic use.  Previously would have a bowel movement about every other day.  He started having 9-10 watery stools daily.  He was concerned that he may be had a blockage because he never passed any type of solid waste.  Because of this he would actually take medication to make himself go more.  He has tried prune juice with milk of magnesia, couple days later passed solid stool.  At one point he was using antidiarrheals, prescription medicine for diarrhea, Pepto but nothing seem to help the watery stools.  He reports completing stool studies which were unremarkable.  He says his labs have been stable.  His primary care manages Coumadin, goes for INR regularly.  Currently subtherapeutic INR 1.2.  Over the past couple of weeks his stools have improved somewhat.  Recently had a [place] 4 stool, first 1 in a long time.  Denies melena, rectal bleeding, abdominal pain.  Has occasional heartburn which he takes Pepto at times.  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  Possibly related to a fall/injury.  He has a history of A-fib but has not seen cardiology in a couple years.  Primary care manages his Coumadin.      Labs  01/2023: sodium 141, potassium 4.1, creatinine 0.64 Labs 09/2022: alb 4, Tbili 0.6, AP 76, AST 18, ALT 19  Patient gives a history of abnormal pancreas noted on CT in 2019.  States has never had anything done about it.  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   Medications   Current Outpatient Medications  Medication Sig Dispense Refill   albuterol (VENTOLIN HFA) 108 (90 Base) MCG/ACT inhaler  See Instructions, 2 puffs inhale every 3 hours as needed for shortness of breath use with spacer chamber, # 1 EA, 3 Refill(s), Pharmacy: [facility] [phone], 200, cm, 07/03/19 11:55:00 EDT, Height/Length Measured     Ascorbic Acid (VITAMIN C PO) Take by mouth.     B Complex Vitamins (B-COMPLEX/B-12) LIQD Place under the tongue.     Cholecalciferol (VITAMIN D-3) 5000 units TABS Take 10,000 Units by mouth daily.     lisinopril (ZESTRIL) 5 MG tablet Take 5 mg by mouth daily.     ondansetron (ZOFRAN) 4 MG tablet Take by mouth.     warfarin (COUMADIN) 10 MG tablet Take 17 mg by mouth daily.     zinc gluconate 50 MG tablet Take 50 mg by mouth daily.     No current facility-administered medications for this visit.    Allergies   Allergies  as of 03/05/2023 - Review Complete 03/05/2023  Allergen Reaction Noted   Codeine Nausea Only 07/28/2018    Past Medical History   Past Medical History:  Diagnosis Date   Atrial fibrillation    DVT (deep venous thrombosis)    Pancreatic mass    Pulmonary embolism    Renal disorder    kidney stones   TIA (transient ischemic attack)    Tobacco use     Past Surgical History   Past Surgical History:  Procedure Laterality Date   CATARACT EXTRACTION, BILATERAL     REPLACEMENT TOTAL KNEE Right 2023   URETERAL STENT PLACEMENT Bilateral    VARICOSE VEIN SURGERY Left     Past Family History   Family History  Problem Relation Age of Onset    Glaucoma Mother    Lung cancer Father    Colon cancer Neg Hx     Past Social History   Social History   Socioeconomic History   Marital status: Married    Spouse name: Not on file   Number of children: Not on file   Years of education: Not on file   Highest education level: Not on file  Occupational History   Not on file  Tobacco Use   Smoking status: Every Day    Packs/day: .25    Types: Cigarettes   Smokeless tobacco: Never  Substance and Sexual Activity   Alcohol use: Not Currently    Comment: ocassionally   Drug use: Never   Sexual activity: Not Currently  Other Topics Concern   Not on file  Social History Narrative   Not on file   Social Determinants of Health   Financial Resource Strain: Not on file  Food Insecurity: Not on file  Transportation Needs: Not on file  Physical Activity: Not on file  Stress: Not on file  Social Connections: Not on file  Intimate Partner Violence: Not on file    Review of Systems   General: Negative for anorexia, weight loss, fever, chills, fatigue, positive weakness. Eyes: Negative for vision changes.  ENT: Negative for hoarseness, difficulty swallowing , nasal congestion. CV: Negative for chest pain, angina, palpitations, dyspnea on exertion, positive peripheral edema.  Respiratory: Negative for dyspnea at rest, dyspnea on exertion, cough, sputum, wheezing.  GI: See history of present illness. GU:  Negative for dysuria, hematuria, urinary incontinence, urinary frequency, nocturnal urination.  MS: Negative for joint pain.  Positive low back pain.  Derm: Negative for rash or itching.  Neuro: Negative for weakness, abnormal sensation, seizure, frequent headaches, memory loss,  confusion.  Psych: Negative for anxiety, depression, suicidal ideation, hallucinations.  Endo: Negative for unusual weight change.  Heme: Negative for bruising or bleeding. Allergy: Negative for rash or hives.  Physical Exam   BP 128/72 (BP  Location: Right Arm, Patient Position: Sitting, Cuff Size: Large)   Pulse 80   Temp (!) 97.5 F (36.4 C) (Oral)   Ht 6\' 6"  (1.981 m)   SpO2 98%   BMI 41.14 kg/m    General: Well-nourished, well-developed in no acute distress.  Head: Normocephalic, atraumatic.   Eyes: Conjunctiva pink, no icterus. Mouth: Oropharyngeal mucosa moist and pink   Neck: Supple without thyromegaly, masses, or lymphadenopathy.  Lungs: Clear to auscultation bilaterally.  Heart: Regular rate and rhythm, no murmurs rubs or gallops.  Abdomen: Bowel sounds are normal, nontender, nondistended, no hepatosplenomegaly or masses,  no abdominal bruits or hernia, no rebound or guarding.   Rectal: Not performed Extremities: No lower  extremity edema. No clubbing or deformities.  Neuro: Alert and oriented x 4 , grossly normal neurologically.  Skin: Warm and dry, no rash or jaundice.   Psych: Alert and cooperative, normal mood and affect.  Labs   See HPI Imaging Studies   11/2022:  GLUCOSE 94 65-99 mg/dL   Fasting reference interval   UREA NITROGEN (BUN) 13 7-25 mg/dL    CREATININE 4.09 8.11-9.14 mg/dL    EGFR 782 > OR = 60 NF/AOZ/3.08M5    BUN/CREATININE RATIO 19 6-22 (calc)    SODIUM 141 135-146 mmol/L    POTASSIUM 3.7 3.5-5.3 mmol/L    CHLORIDE 108 98-110 mmol/L    CARBON DIOXIDE 24 20-32 mmol/L    CALCIUM 9.0 8.6-10.3 mg/dL    PROTEIN, TOTAL 6.8 7.8-4.6 g/dL    ALBUMIN 3.9 9.6-2.9 g/dL    GLOBULIN 2.9 5.2-8.4 g/dL (calc)    ALBUMIN/GLOBULIN RATIO 1.3 1.0-2.5 (calc)    BILIRUBIN, TOTAL 0.5 0.2-1.2 mg/dL    ALKALINE PHOSPHATASE 66 35-144 U/L    AST 14 10-35 U/L    ALT 15 9-46 U/L     OVA AND PARASITES, CONC AND PERM SMEAR       OVA AND PARASITES, CONC AND PERM SMEAR  Micro Number: [id number]  Test Status: Final  Specimen Source: Tnp, tf empty  Specimen Quality: Inadequate  CONCENTRATION 1: Test not performed. An empty specimen container  was received.  TRICHROME 1: Test not performed.   GIARDIA AG,  EIA, STOOL Reviewed date:11/27/2022 08:41:02 AM Interpretation: Performing Lab:LL3, [facility] [address], [address], [place] [address] [place] Notes/Report: FASTING:UNKNOWN FASTING: UNKNOWN  GIARDIA AG, EIA, STOOL       GIARDIA AG, EIA, STOOL  Micro Number: [id number]  Test Status: Final  Specimen Source: No stool received  Specimen Quality: Inadequate  Giardia Result 1: Test not performed. The specimen submitted did  not meet the specimen requirements. Please  refer to the [employer] On-Line Test  Directory or call Client Services for proper  requirements.  Reference Range: Not Detected   C-Diff A & B (Stool) Reviewed date:11/22/2022 08:48:55 AM Interpretation: Performing Lab:LL3, [facility] [address], [place] [address] [place] Notes/Report: FASTING:UNKNOWN FASTING: UNKNOWN  CLOSTRIDIUM DIFFICILE TOXIN/GDH W/REFL TO PCR SEE NOTE     CLOSTRIDIUM DIFFICILE TOXIN/GDH W/REFL TO PCR  Micro Number: [id number]  Test Status: Final  Specimen Source: Stool  Specimen Quality: Adequate  GDH Antigen: Not Detected  Toxin A and B: Not Detected  COMMENT: No toxigenic C. difficile detected  For additional information, please refer to  http://education.[facility].com/faq/FAQ136  (This link is being provided for  informational/educational purposes only.)   SALMONELLA/SHIGELLA CULT, CAMPY EIA AND SHIGA TOXIN W/RFL E. COLI O157 CULT Reviewed date:11/22/2022 08:50:33 AM Interpretation: Performing Lab:LL3, [facility] [address], [place] [address] [place] Notes/Report: FASTING:UNKNOWN FASTING: UNKNOWN  SHIGA TOXINS, EIA W/RFL TO E.COLI O157 CULTURE SEE NOTE     SHIGA TOXINS, EIA W/RFL TO E.COLI O157 CULTURE  Micro Number: [id number]  Test Status: Final  Specimen Source: Stool  Specimen Quality: Adequate  Shiga Toxin: Not Detected  Reference  Range: Not Detected   SALMONELLA AND SHIGELLA, CULTURE SEE NOTE     SALMONELLA AND SHIGELLA, CULTURE  Micro Number: [id number]  Test Status: Final  Specimen Source: Stool  Specimen Quality: Adequate  Result: No Salmonella or Shigella isolated       Assessment   Chronic diarrhea: stool studies in 11/2022  with negative salmonella/shigella, negative CDIFF GDH/Toxin A/B. Stools somewhat improving. Will screen for celiac disease. Plan for colonoscopy in the near future. Consider random colon biopsies based on finding.   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 Patient brought to our attention that he never completed recommended MRI for better characterization.    PLAN   Complete labs.  Once results available we will proceed with colonoscopy. Will discuss holding coumadin with prescribing provider.  Consider MRI pancreas to evaluate pancreatic mass.   Leanna Battles. Melvyn Neth, MHS, PA-C [facility]  ADDENDUM: PATIENT REQUIRED LOVENOX BRIDGE. PCP MONITORS COUMADIN AND REQUESTED GI TO PROVIDE LOVENOX BRIDGE INSTRUCTIONS.  DR Jena Gauss TO PROVIDE INSTRUCTIONS POST-OP REGARDING TIMING OF RESUMING LOVENOX/COUMADIN AND NEXT INR.

## 2023-03-05 ENCOUNTER — Ambulatory Visit: Payer: Medicare PPO | Admitting: Gastroenterology

## 2023-03-05 ENCOUNTER — Encounter: Payer: Self-pay | Admitting: Gastroenterology

## 2023-03-05 VITALS — BP 128/72 | HR 80 | Temp 97.5°F | Ht 78.0 in

## 2023-03-05 DIAGNOSIS — K529 Noninfective gastroenteritis and colitis, unspecified: Secondary | ICD-10-CM | POA: Diagnosis not present

## 2023-03-05 DIAGNOSIS — K8689 Other specified diseases of pancreas: Secondary | ICD-10-CM | POA: Diagnosis not present

## 2023-03-05 NOTE — Patient Instructions (Signed)
Please complete labs. We will also request copy of stools and labs done recently by PCP. Once we have results and have discussed holding coumadin for colonoscopy, we will be in touch to schedule.

## 2023-03-11 LAB — CBC WITH DIFFERENTIAL/PLATELET
Basophils Absolute: 0.1 10*3/uL (ref 0.0–0.2)
Basos: 1 %
EOS (ABSOLUTE): 0.3 10*3/uL (ref 0.0–0.4)
Eos: 5 %
Hematocrit: 46.9 % (ref 37.5–51.0)
Hemoglobin: 15.2 g/dL (ref 13.0–17.7)
Immature Grans (Abs): 0 10*3/uL (ref 0.0–0.1)
Immature Granulocytes: 0 %
Lymphocytes Absolute: 2.9 10*3/uL (ref 0.7–3.1)
Lymphs: 43 %
MCH: 29.5 pg (ref 26.6–33.0)
MCHC: 32.4 g/dL (ref 31.5–35.7)
MCV: 91 fL (ref 79–97)
Monocytes Absolute: 0.6 10*3/uL (ref 0.1–0.9)
Monocytes: 9 %
Neutrophils Absolute: 2.8 10*3/uL (ref 1.4–7.0)
Neutrophils: 42 %
Platelets: 180 10*3/uL (ref 150–450)
RBC: 5.15 x10E6/uL (ref 4.14–5.80)
RDW: 12.6 % (ref 11.6–15.4)
WBC: 6.8 10*3/uL (ref 3.4–10.8)

## 2023-03-11 LAB — LIPASE: Lipase: 44 U/L (ref 13–78)

## 2023-03-11 LAB — TISSUE TRANSGLUTAMINASE, IGA: Transglutaminase IgA: <2 U/mL (ref 0–3)

## 2023-03-11 LAB — TSH+FREE T4
Free T4: 1.37 ng/dL (ref 0.82–1.77)
TSH: 2.84 u[IU]/mL (ref 0.450–4.500)

## 2023-03-11 LAB — IGA: IgA/Immunoglobulin A, Serum: 345 mg/dL (ref 61–437)

## 2023-03-12 ENCOUNTER — Telehealth: Payer: Self-pay

## 2023-03-12 NOTE — Telephone Encounter (Signed)
Pt called wanting to know recent lab results, informed pt that we have five business days to get them to him. Pt verbalized understanding.

## 2023-03-12 NOTE — Telephone Encounter (Signed)
See result note.  

## 2023-03-14 ENCOUNTER — Telehealth: Payer: Self-pay | Admitting: *Deleted

## 2023-03-14 NOTE — Telephone Encounter (Signed)
Noted faxed to PCP

## 2023-03-14 NOTE — Telephone Encounter (Signed)
  Request for patient to stop medication prior to procedure or is needing cleareance  03/14/23  Roger Phelps [birth year]  What type of surgery is being performed? Colonoscopy  When is surgery scheduled? TBD  Clearance to hold Coumadin 4 days before procedure and will he need lovenox bridge?  Name of physician performing surgery?  Dr. Gavin [facility] at [facility] Phone: [phone] Fax: [phone]  Anethesia type (none, local, MAC, general)? MAC

## 2023-03-19 NOTE — Telephone Encounter (Signed)
Clearance scanned under pt's media tab. Please advise. Thank you

## 2023-03-19 NOTE — Telephone Encounter (Signed)
Ok, I will give orders for lovenox bridge once he has a date.   He will be holding coumadin starting four days before his procedure. Once you have a date, let me know and I will send in rx for lovenox and give you instructions to pass to patient.

## 2023-03-22 NOTE — Telephone Encounter (Signed)
LMOVM to call back to schedule 

## 2023-03-25 ENCOUNTER — Other Ambulatory Visit: Payer: Self-pay | Admitting: *Deleted

## 2023-03-25 ENCOUNTER — Encounter: Payer: Self-pay | Admitting: *Deleted

## 2023-03-25 MED ORDER — PEG 3350-KCL-NA BICARB-NACL 420 G PO SOLR: 4000.0000 mL | Freq: Once | ORAL | 0 refills | Status: AC

## 2023-03-25 NOTE — Telephone Encounter (Signed)
Pt has been scheduled for 04/23/23 at 1pm. Instructions mailed and prep sent to the pharmacy.  Please advise on Lovenox. Thank you

## 2023-03-25 NOTE — Telephone Encounter (Signed)
I went to determine his Lovenox dose and apparently we did not weigh him the day he was in the office last month. Last weight on file from 2022. Can you call and get a current weight?

## 2023-03-25 NOTE — Telephone Encounter (Signed)
Lmom for pt to return my call.  

## 2023-03-26 NOTE — Telephone Encounter (Signed)
Pt states that he was weighed last week when he went for his infusions and he was 363lb.

## 2023-03-27 MED ORDER — ENOXAPARIN SODIUM 150 MG/ML IJ SOSY: 150.0000 mg | PREFILLED_SYRINGE | Freq: Two times a day (BID) | INTRAMUSCULAR | 0 refills | Status: DC

## 2023-03-27 NOTE — Addendum Note (Signed)
Addended by: Tiffany Kocher on: 03/27/2023 07:00 PM   Modules accepted: Orders

## 2023-03-27 NOTE — Telephone Encounter (Signed)
Please follow the Medication Instructions below for your procedure:   You need to hold your coumadin four days before your procedure and start a Lovenox bridge.   Your procedure is scheduled for 04/23/23. RX for Lovenox has been sent to your pharmacy. Please pick up asap in case they have to order it, etc.           04/18/23  Last dose of warfarin         04/19/23   No Coumadin/warfarin         04/20/23   No Coumadin/warfarin     Begin Lovenox/enoxaparin [place] 150mg  at 10am and 150mg  at 10pm      04/21/23 No Coumadin/warfarin    Lovenox/enoxaparin [place] 150mg  at10am and 150mg  at 10pm    04/22/23    No Coumadin/warfarin    Lovenox/enoxaparin [place] 150mg  at 10am and 150mg  at 10pm     04/23/23 Day of procedue    No Coumadin/warfarin    No Lovenox/enoxaparin      Dr. Jena Gauss will provide you with further instructions about your coumadin and Lovenox after your procedure.           You will be instructed when to get your next INR checked at the coumadin clinic.

## 2023-03-28 ENCOUNTER — Encounter: Payer: Self-pay | Admitting: *Deleted

## 2023-03-28 NOTE — Telephone Encounter (Signed)
Pt informed and instructions sent to pt via MyChart.

## 2023-04-12 NOTE — Patient Instructions (Signed)
[facility]ES  04/12/2023     @PREFPERIOPPHARMACY @   Your procedure is scheduled on  04/23/2023.   [facility] to [facility] at  1115  A.M.   Call this number if you have problems the morning of surgery:  [phone]  If you experience any cold or flu symptoms such as cough, fever, chills, shortness of breath, etc. between now and your scheduled surgery, please notify us at the above number.   Remember:  Follow the diet and prep instructions given to you by the office.      Your last dose of coumadin should be on 5/30. Follow the lovenox instructed given to you by [facility] [place].     Take these medicines the morning of surgery with A SIP OF WATER                                                zofran.    Do not wear jewelry, make-up or nail polish, including gel polish,  artificial nails, or any other type of covering on natural nails (fingers and  toes).   Do not wear lotions, powders, or perfumes, or deodorant.  Do not shave 48 hours prior to surgery.  Men may shave face and neck.  Do not bring valuables to the hospital.  [facility] is not responsible for any belongings or valuables.  Contacts, dentures or bridgework may not be worn into surgery.  Leave your suitcase in the car.  After surgery it may be brought to your room.  For patients admitted to the hospital, discharge time will be determined by your treatment team.  Patients discharged the day of surgery will not be allowed to drive home and must have someone with them for 24 hours.    Special instructions:   DO NOT smoke tobacco or vape for 24 hours before your procedure.  Please read over the following fact sheets that you were given. Anesthesia Post-op Instructions and Care and Recovery After Surgery      Colonoscopy, Adult, Care After The following information offers guidance on how to care for yourself after your procedure. Your health care provider may also give you more specific instructions. If you  have problems or questions, contact your health care provider. What can I expect after the procedure? After the procedure, it is common to have: A small amount of blood in your stool for 24 hours after the procedure. Some gas. Mild cramping or bloating of your abdomen. Follow these instructions at home: Eating and drinking  Drink enough fluid to keep your urine pale yellow. Follow instructions from your health care provider about eating or drinking restrictions. Resume your normal diet as told by your health care provider. Avoid heavy or fried foods that are hard to digest. Activity Rest as told by your health care provider. Avoid sitting for a long time without moving. Get up to take short walks every 1-2 hours. This is important to improve blood flow and breathing. Ask for help if you feel weak or unsteady. Return to your normal activities as told by your health care provider. Ask your health care provider what activities are safe for you. Managing cramping and bloating  Try walking around when you have cramps or feel bloated. If directed, apply heat to your abdomen as told by your health care provider.  Use the heat source that your health care provider recommends, such as a moist heat pack or a heating pad. Place a towel between your skin and the heat source. Leave the heat on for 20-30 minutes. Remove the heat if your skin turns bright red. This is especially important if you are unable to feel pain, heat, or cold. You have a greater risk of getting burned. General instructions If you were given a sedative during the procedure, it can affect you for several hours. Do not drive or operate machinery until your health care provider says that it is safe. For the first 24 hours after the procedure: Do not sign important documents. Do not drink alcohol. Do your regular daily activities at a slower pace than normal. Eat soft foods that are easy to digest. Take over-the-counter and  prescription medicines only as told by your health care provider. Keep all follow-up visits. This is important. Contact a health care provider if: You have blood in your stool 2-3 days after the procedure. Get help right away if: You have more than a small spotting of blood in your stool. You have large blood clots in your stool. You have swelling of your abdomen. You have nausea or vomiting. You have a fever. You have increasing pain in your abdomen that is not relieved with medicine. These symptoms may be an emergency. Get help right away. Call 911. Do not wait to see if the symptoms will go away. Do not drive yourself to the hospital. Summary After the procedure, it is common to have a small amount of blood in your stool. You may also have mild cramping and bloating of your abdomen. If you were given a sedative during the procedure, it can affect you for several hours. Do not drive or operate machinery until your health care provider says that it is safe. Get help right away if you have a lot of blood in your stool, nausea or vomiting, a fever, or increased pain in your abdomen. This information is not intended to replace advice given to you by your health care provider. Make sure you discuss any questions you have with your health care provider. Document Revised: 12/18/2022 Document Reviewed: 06/28/2021 Elsevier Patient Education  2024 [employer] Monitored Anesthesia Care, Care After The following information offers guidance on how to care for yourself after your procedure. Your health care provider may also give you more specific instructions. If you have problems or questions, contact your health care provider. What can I expect after the procedure? After the procedure, it is common to have: Tiredness. Little or no memory about what happened during or after the procedure. Impaired judgment when it comes to making decisions. Nausea or vomiting. Some trouble with balance. Follow  these instructions at home: For the time period you were told by your health care provider:  Rest. Do not participate in activities where you could fall or become injured. Do not drive or use machinery. Do not drink alcohol. Do not take sleeping pills or medicines that cause drowsiness. Do not make important decisions or sign legal documents. Do not take care of children on your own. Medicines Take over-the-counter and prescription medicines only as told by your health care provider. If you were prescribed antibiotics, take them as told by your health care provider. Do not stop using the antibiotic even if you start to feel better. Eating and drinking Follow instructions from your health care provider about what you may eat and drink. Drink enough  fluid to keep your urine pale yellow. If you vomit: Drink clear fluids slowly and in small amounts as you are able. Clear fluids include water, ice chips, low-calorie sports drinks, and fruit juice that has water added to it (diluted fruit juice). Eat light and bland foods in small amounts as you are able. These foods include bananas, applesauce, rice, lean meats, toast, and crackers. General instructions  Have a responsible adult stay with you for the time you are told. It is important to have someone help care for you until you are awake and alert. If you have sleep apnea, surgery and some medicines can increase your risk for breathing problems. Follow instructions from your health care provider about wearing your sleep device: When you are sleeping. This includes during daytime naps. While taking prescription pain medicines, sleeping medicines, or medicines that make you drowsy. Do not use any products that contain nicotine or tobacco. These products include cigarettes, chewing tobacco, and vaping devices, such as e-cigarettes. If you need help quitting, ask your health care provider. Contact a health care provider if: You feel nauseous or  vomit every time you eat or drink. You feel light-headed. You are still sleepy or having trouble with balance after 24 hours. You get a rash. You have a fever. You have redness or swelling around the IV site. Get help right away if: You have trouble breathing. You have new confusion after you get home. These symptoms may be an emergency. Get help right away. Call 911. Do not wait to see if the symptoms will go away. Do not drive yourself to the hospital. This information is not intended to replace advice given to you by your health care provider. Make sure you discuss any questions you have with your health care provider. Document Revised: 04/02/2022 Document Reviewed: 04/02/2022 Elsevier Patient Education  2024 [employer].

## 2023-04-18 ENCOUNTER — Encounter (HOSPITAL_COMMUNITY)
Admission: RE | Admit: 2023-04-18 | Discharge: 2023-04-18 | Disposition: A | Discharge status: Home / Self Care | Payer: Medicare PPO | Source: Ambulatory Visit | Attending: Internal Medicine | Admitting: Internal Medicine

## 2023-04-18 ENCOUNTER — Encounter (HOSPITAL_COMMUNITY): Payer: Self-pay

## 2023-04-18 VITALS — BP 130/87 | HR 88 | Temp 97.6°F | Resp 18 | Ht 78.0 in | Wt 356.0 lb

## 2023-04-18 DIAGNOSIS — Z720 Tobacco use: Secondary | ICD-10-CM | POA: Diagnosis not present

## 2023-04-18 DIAGNOSIS — I4891 Unspecified atrial fibrillation: Secondary | ICD-10-CM | POA: Diagnosis not present

## 2023-04-18 DIAGNOSIS — Z01810 Encounter for preprocedural cardiovascular examination: Secondary | ICD-10-CM | POA: Insufficient documentation

## 2023-04-18 HISTORY — DX: Unspecified osteoarthritis, unspecified site: M19.90

## 2023-04-18 HISTORY — DX: Gastro-esophageal reflux disease without esophagitis: K21.9

## 2023-04-18 HISTORY — DX: Essential (primary) hypertension: I10

## 2023-04-18 HISTORY — DX: Cardiac arrhythmia, unspecified: I49.9

## 2023-04-18 HISTORY — DX: Cerebral infarction, unspecified: I63.9

## 2023-04-18 HISTORY — DX: Dyspnea, unspecified: R06.00

## 2023-04-18 HISTORY — DX: Personal history of urinary calculi: Z87.442

## 2023-04-19 ENCOUNTER — Encounter: Payer: Self-pay | Admitting: Internal Medicine

## 2023-04-19 ENCOUNTER — Ambulatory Visit: Payer: Medicare PPO | Attending: Cardiology | Admitting: Internal Medicine

## 2023-04-19 VITALS — BP 124/76 | HR 74 | Ht 78.0 in | Wt 367.8 lb

## 2023-04-19 DIAGNOSIS — I4891 Unspecified atrial fibrillation: Secondary | ICD-10-CM

## 2023-04-19 DIAGNOSIS — I5030 Unspecified diastolic (congestive) heart failure: Secondary | ICD-10-CM | POA: Diagnosis not present

## 2023-04-19 DIAGNOSIS — Z8673 Personal history of transient ischemic attack (TIA), and cerebral infarction without residual deficits: Secondary | ICD-10-CM | POA: Diagnosis not present

## 2023-04-19 DIAGNOSIS — Z79899 Other long term (current) drug therapy: Secondary | ICD-10-CM | POA: Diagnosis not present

## 2023-04-19 MED ORDER — FUROSEMIDE 40 MG PO TABS: 40.0000 mg | ORAL_TABLET | Freq: Every day | ORAL | 1 refills | Status: AC

## 2023-04-19 MED ORDER — POTASSIUM CHLORIDE CRYS ER 20 MEQ PO TBCR: 20.0000 meq | EXTENDED_RELEASE_TABLET | Freq: Every day | ORAL | 1 refills | Status: AC

## 2023-04-19 NOTE — Progress Notes (Addendum)
Cardiology Office Note  Date: 04/19/2023   ID: [name], [name] 1956/10/24, MRN [id number]  PCP:  Wilmon Pali, FNP  Cardiologist:  Marjo Bicker, MD Electrophysiologist:  None   Reason for Office Visit: Management of Coumadin at the request of Roger Edis, FNP   History of Present Illness: Roger Phelps is a 67 y.o. male known to have longstanding persistent A-fib on Coumadin, history of DVT/PE on Coumadin since 2010, history of TIA, HTN was referred to cardiology clinic for management of Coumadin.  Patient has chronic atrial fibrillation, EKG from 2019 showed atrial fibrillation, rate controlled. Not on AV nodal agents. He has been on Coumadin since 2010 due to history of DVT/PEs. He has no angina but has DOE and bilateral lower extremity swelling.  He uses inhalers with partial relief.  No dizziness, syncope, palpitations.  He is undergoing colonoscopy procedure next week Tuesday.  He is currently off Coumadin and on Lovenox shots.  Past Medical History:  Diagnosis Date   Arthritis    Atrial fibrillation (HCC)    DVT (deep venous thrombosis) (HCC)    Dyspnea    Dysrhythmia    GERD (gastroesophageal reflux disease)    History of kidney stones    Hypertension    Pancreatic mass    Pulmonary embolism (HCC)    Renal disorder    kidney stones   Stroke (HCC)    TIA (transient ischemic attack)    Tobacco use     Past Surgical History:  Procedure Laterality Date   CATARACT EXTRACTION, BILATERAL     REPLACEMENT TOTAL KNEE Right 2023   URETERAL STENT PLACEMENT Bilateral    VARICOSE VEIN SURGERY Left     Current Outpatient Medications  Medication Sig Dispense Refill   albuterol (VENTOLIN HFA) 108 (90 Base) MCG/ACT inhaler Inhale 2 puffs into the lungs every 6 (six) hours as needed for shortness of breath or wheezing.     ascorbic acid (VITAMIN C) 500 MG tablet Take 500 mg by mouth daily.     b complex vitamins capsule Take 1 capsule by mouth daily.      Cholecalciferol (VITAMIN D-3) 5000 units TABS Take 10,000 Units by mouth daily.     furosemide (LASIX) 40 MG tablet Take 1 tablet (40 mg total) by mouth daily. 90 tablet 1   lisinopril (ZESTRIL) 5 MG tablet Take 5 mg by mouth daily as needed (SBP > 140).     magnesium hydroxide (MILK OF MAGNESIA) 400 MG/5ML suspension Take 15 mLs by mouth daily as needed for mild constipation.     ondansetron (ZOFRAN) 4 MG tablet Take 4 mg by mouth daily.     potassium chloride SA (KLOR-CON M) 20 MEQ tablet Take 1 tablet (20 mEq total) by mouth daily. 90 tablet 1   vitamin B-12 (CYANOCOBALAMIN) 500 MCG tablet Take 500 mcg by mouth daily.     warfarin (COUMADIN) 1 MG tablet Take 3 mg by mouth daily.     warfarin (COUMADIN) 10 MG tablet Take 10 mg by mouth daily.     warfarin (COUMADIN) 3 MG tablet Take 6 mg by mouth daily.     zinc gluconate 50 MG tablet Take 50 mg by mouth daily.     enoxaparin (LOVENOX) 150 MG/ML injection Inject 1 mL (150 mg total) into the skin every 12 (twelve) hours. As directed on separate Lovenox/surgery instructions provided (Patient not taking: Reported on 04/19/2023) 12 mL 0   No current facility-administered medications for this  visit.   Allergies:  Codeine   Social History: The patient  reports that he has been smoking cigarettes. He has been smoking an average of .25 packs per day. He has never used smokeless tobacco. He reports that he does not currently use alcohol. He reports that he does not use drugs.   Family History: The patient's family history includes Glaucoma in his mother; Lung cancer in his father.   ROS:  Please see the history of present illness. Otherwise, complete review of systems is positive for none  All other systems are reviewed and negative.   Physical Exam: VS:  BP 124/76   Pulse 74   Ht 6\' 6"  (1.981 m)   Wt (!) 367 lb 12.8 oz (166.8 kg)   SpO2 96%   BMI 42.50 kg/m , BMI Body mass index is 42.5 kg/m.  Wt Readings from Last 3 Encounters:  04/19/23  (!) 367 lb 12.8 oz (166.8 kg)  04/18/23 (!) 356 lb 0.7 oz (161.5 kg)  09/25/21 (!) 356 lb (161.5 kg)    General: Patient appears comfortable at rest. HEENT: Conjunctiva and lids normal, oropharynx clear with moist mucosa. Neck: Supple, no elevated JVP or carotid bruits, no thyromegaly. Lungs: Clear to auscultation, nonlabored breathing at rest. Cardiac: Regular rate and rhythm, no S3 or significant systolic murmur, no pericardial rub. Abdomen: Soft, nontender, no hepatomegaly, bowel sounds present, no guarding or rebound. Extremities: 1-2+ pitting edema, distal pulses 2+. Skin: Warm and dry. Musculoskeletal: No kyphosis. Neuropsychiatric: Alert and oriented x3, affect grossly appropriate.  Recent Labwork: 03/08/2023: Hemoglobin 15.2; Platelets 180; TSH 2.840     Component Value Date/Time   CHOL 121 07/28/2018 0537   TRIG 80 07/28/2018 0537   HDL 22 (L) 07/28/2018 0537   CHOLHDL 5.5 07/28/2018 0537   VLDL 16 07/28/2018 0537   LDLCALC 83 07/28/2018 0537    Other Studies Reviewed Today: Echocardiogram from 2019 LVEF normal Diastology not evaluated clearly Mild MR  Assessment and Plan: Patient is a 67 year old M known to have longstanding persistent A-fib on Coumadin, history of DVT/PE on Coumadin since 2010, history of TIA, HTN was referred to cardiology clinic for management of Coumadin.  # Longstanding persistent atrial fibrillation, rate controlled -EKG today showed rate controlled A-fib, not on AV nodal agents. -On Coumadin for anticoagulation. But currently off Coumadin due to upcoming colonoscopy procedure. Bridged with subcutaneous therapeutic Lovenox injections. Will place referral to Coumadin clinic for management of INR. Goal INR between 2 and 3. -Patient does not want to use CPAP.  He underwent sleep study in the past.  TSH within normal limits.  # DOE and bilateral lower EXTR swelling, rule out HFpEF -Patient has been having DOE and bilateral lower EXTR swelling for  quite a while.  He is using inhalers with partial relief. Obtain 2D echocardiogram -Start p.o. Lasix 40 mg once daily and KCl 20 mEq once daily.  # History of DVT/PE on Coumadin since 2010 -Continue Coumadin and referral to Coumadin clinic placed.  # History of TIA -Discontinue aspirin while on Coumadin. Patient reported he was not taking aspirin anyway.  I have spent a total of 45 minutes with patient reviewing chart, EKGs, labs and examining patient as well as establishing an assessment and plan that was discussed with the patient.  > 50% of time was spent in direct patient care.    Medication Adjustments/Labs and Tests Ordered: Current medicines are reviewed at length with the patient today.  Concerns regarding medicines are outlined above.  Tests Ordered: Orders Placed This Encounter  Procedures   Basic metabolic panel   Amb Referral to AFIB Clinic   ECHOCARDIOGRAM COMPLETE    Medication Changes: Meds ordered this encounter  Medications   furosemide (LASIX) 40 MG tablet    Sig: Take 1 tablet (40 mg total) by mouth daily.    Dispense:  90 tablet    Refill:  1    04/19/2023 NEW   potassium chloride SA (KLOR-CON M) 20 MEQ tablet    Sig: Take 1 tablet (20 mEq total) by mouth daily.    Dispense:  90 tablet    Refill:  1    04/19/2023 NEW    Disposition:  Follow up  1 year  Signed Chadric Kimberley Verne Spurr, MD, 04/19/2023 11:45 AM    [facility] [address] [place], [place], [place] [address]

## 2023-04-19 NOTE — Patient Instructions (Addendum)
Medication Instructions:  Your physician has recommended you make the following change in your medication:  Stop aspirin Start furosemide 40 mg daily Start potassium 20 meq daily Continue other medications the same  Labwork: BMET next week (04/23/2023) [facility] ([address] [place]) or [facility] Non-fasting  Testing/Procedures: Your physician has requested that you have an echocardiogram. Echocardiography is a painless test that uses sound waves to create images of your heart. It provides your doctor with information about the size and shape of your heart and how well your heart's chambers and valves are working. This procedure takes approximately one hour. There are no restrictions for this procedure. Please do NOT wear cologne, perfume, aftershave, or lotions (deodorant is allowed). Please arrive 15 minutes prior to your appointment time.  Follow-Up: Your physician recommends that you schedule a follow-up appointment in: 1 year. You will receive a reminder call in the mail in about 10 months reminding you to call and schedule your appointment. If you don't receive this call, please contact our office.  Any Other Special Instructions Will Be Listed Below (If Applicable). You have been referred to Coumadin Clinic If you need a refill on your cardiac medications before your next appointment, please call your pharmacy.

## 2023-04-23 ENCOUNTER — Encounter (HOSPITAL_COMMUNITY)
Admission: RE | Disposition: A | Discharge status: Home / Self Care | Payer: Self-pay | Source: Home / Self Care | Attending: Internal Medicine

## 2023-04-23 ENCOUNTER — Other Ambulatory Visit (HOSPITAL_COMMUNITY)
Admission: RE | Admit: 2023-04-23 | Discharge: 2023-04-23 | Disposition: A | Discharge status: Home / Self Care | Payer: Medicare PPO | Source: Ambulatory Visit | Attending: Cardiology | Admitting: Cardiology

## 2023-04-23 ENCOUNTER — Ambulatory Visit (HOSPITAL_COMMUNITY): Payer: Medicare PPO | Admitting: Anesthesiology

## 2023-04-23 ENCOUNTER — Ambulatory Visit (HOSPITAL_COMMUNITY)
Admission: RE | Admit: 2023-04-23 | Discharge: 2023-04-23 | Disposition: A | Discharge status: Home / Self Care | Payer: Medicare PPO | Attending: Internal Medicine | Admitting: Internal Medicine

## 2023-04-23 ENCOUNTER — Encounter (HOSPITAL_COMMUNITY): Payer: Self-pay | Admitting: Internal Medicine

## 2023-04-23 ENCOUNTER — Ambulatory Visit (HOSPITAL_BASED_OUTPATIENT_CLINIC_OR_DEPARTMENT_OTHER): Payer: Medicare PPO | Admitting: Anesthesiology

## 2023-04-23 DIAGNOSIS — Z8673 Personal history of transient ischemic attack (TIA), and cerebral infarction without residual deficits: Secondary | ICD-10-CM | POA: Insufficient documentation

## 2023-04-23 DIAGNOSIS — D125 Benign neoplasm of sigmoid colon: Secondary | ICD-10-CM | POA: Diagnosis not present

## 2023-04-23 DIAGNOSIS — Z7901 Long term (current) use of anticoagulants: Secondary | ICD-10-CM | POA: Diagnosis not present

## 2023-04-23 DIAGNOSIS — K529 Noninfective gastroenteritis and colitis, unspecified: Secondary | ICD-10-CM | POA: Insufficient documentation

## 2023-04-23 DIAGNOSIS — I10 Essential (primary) hypertension: Secondary | ICD-10-CM | POA: Insufficient documentation

## 2023-04-23 DIAGNOSIS — D126 Benign neoplasm of colon, unspecified: Secondary | ICD-10-CM

## 2023-04-23 DIAGNOSIS — F17210 Nicotine dependence, cigarettes, uncomplicated: Secondary | ICD-10-CM | POA: Diagnosis not present

## 2023-04-23 DIAGNOSIS — Q438 Other specified congenital malformations of intestine: Secondary | ICD-10-CM | POA: Diagnosis not present

## 2023-04-23 DIAGNOSIS — I4821 Permanent atrial fibrillation: Secondary | ICD-10-CM | POA: Diagnosis not present

## 2023-04-23 HISTORY — PX: COLONOSCOPY WITH PROPOFOL: SHX5780

## 2023-04-23 HISTORY — PX: POLYPECTOMY: SHX5525

## 2023-04-23 HISTORY — PX: BIOPSY: SHX5522

## 2023-04-23 LAB — BASIC METABOLIC PANEL
Anion gap: 9 (ref 5–15)
BUN: 8 mg/dL (ref 8–23)
CO2: 22 mmol/L (ref 22–32)
Calcium: 8.4 mg/dL — ABNORMAL LOW (ref 8.9–10.3)
Chloride: 104 mmol/L (ref 98–111)
Creatinine, Ser: 0.68 mg/dL (ref 0.61–1.24)
GFR, Estimated: >60 mL/min (ref >60)
Glucose, Bld: 109 mg/dL — ABNORMAL HIGH (ref 70–99)
Potassium: 3.8 mmol/L (ref 3.5–5.1)
Sodium: 135 mmol/L (ref 135–145)

## 2023-04-23 SURGERY — COLONOSCOPY WITH PROPOFOL
Anesthesia: General

## 2023-04-23 MED ORDER — LIDOCAINE HCL 1 % IJ SOLN
INTRAMUSCULAR | Status: DC | PRN
  Administered 2023-04-23: 50 mg via INTRADERMAL

## 2023-04-23 MED ORDER — LACTATED RINGERS IV SOLN: INTRAVENOUS | Status: DC | PRN

## 2023-04-23 MED ORDER — PROPOFOL 500 MG/50ML IV EMUL
INTRAVENOUS | Status: DC | PRN
  Administered 2023-04-23: 125 ug/kg/min via INTRAVENOUS

## 2023-04-23 MED ORDER — PROPOFOL 10 MG/ML IV BOLUS
INTRAVENOUS | Status: DC | PRN
  Administered 2023-04-23: 50 mg via INTRAVENOUS
  Administered 2023-04-23: 100 mg via INTRAVENOUS
  Administered 2023-04-23: 20 mg via INTRAVENOUS
  Administered 2023-04-23: 50 mg via INTRAVENOUS

## 2023-04-23 NOTE — Transfer of Care (Signed)
Immediate Anesthesia Transfer of Care Note  Patient: Roger Phelps  Procedure(s) Performed: COLONOSCOPY WITH PROPOFOL BIOPSY POLYPECTOMY  Patient Location: Short Stay  Anesthesia Type:General  Level of Consciousness: awake  Airway & Oxygen Therapy: Patient Spontanous Breathing  Post-op Assessment: Report given to RN  Post vital signs: Reviewed and stable  Last Vitals:  Vitals Value Taken Time  BP 117/62 04/23/23 1312  Temp 36.4 C 04/23/23 1312  Pulse 71 04/23/23 1312  Resp 21 04/23/23 1312  SpO2 97 % 04/23/23 1312    Last Pain:  Vitals:   04/23/23 1312  TempSrc: Oral  PainSc: 0-No pain         Complications: No notable events documented.

## 2023-04-23 NOTE — Anesthesia Postprocedure Evaluation (Signed)
Anesthesia Post Note  Patient: HASKEL TARDY  Procedure(s) Performed: COLONOSCOPY WITH PROPOFOL BIOPSY POLYPECTOMY  Patient location during evaluation: Short Stay Anesthesia Type: General Level of consciousness: awake and alert Pain management: pain level controlled Vital Signs Assessment: post-procedure vital signs reviewed and stable Respiratory status: spontaneous breathing Cardiovascular status: blood pressure returned to baseline and stable Postop Assessment: no apparent nausea or vomiting Anesthetic complications: no   No notable events documented.   Last Vitals:  Vitals:   04/23/23 1130 04/23/23 1312  BP: 133/62 117/62  Pulse: 72 71  Resp:  (!) 21  Temp: 36.4 C 36.4 C  SpO2: 98% 97%    Last Pain:  Vitals:   04/23/23 1312  TempSrc: Oral  PainSc: 0-No pain                 Bronte Sabado

## 2023-04-23 NOTE — Anesthesia Postprocedure Evaluation (Signed)
Anesthesia Post Note  Patient: Ab W Hovatter  Procedure(s) Performed: COLONOSCOPY WITH PROPOFOL BIOPSY POLYPECTOMY  Patient location during evaluation: Short Stay Anesthesia Type: General Level of consciousness: awake and alert Pain management: pain level controlled Vital Signs Assessment: post-procedure vital signs reviewed and stable Respiratory status: spontaneous breathing Cardiovascular status: blood pressure returned to baseline and stable Postop Assessment: no apparent nausea or vomiting Anesthetic complications: no   No notable events documented.   Last Vitals:  Vitals:   04/23/23 1130 04/23/23 1312  BP: 133/62 117/62  Pulse: 72 71  Resp:  (!) 21  Temp: 36.4 C 36.4 C  SpO2: 98% 97%    Last Pain:  Vitals:   04/23/23 1312  TempSrc: Oral  PainSc: 0-No pain                 Deaun Rocha      

## 2023-04-23 NOTE — Progress Notes (Signed)
Large bruise to right lower abdominal flank area. Pt states it was from lovenox shots. No drainage present.

## 2023-04-23 NOTE — H&P (Signed)
@LOGO @   Primary Care Physician:  Wilmon Pali, FNP Primary Gastroenterologist:  Dr.   Pre-Procedure History & Physical: HPI:  Roger Phelps is a 67 y.o. male here for For diagnostic colonoscopy.  Chronic diarrhea.  No prior colonoscopy.  Currently coagulated for permanent atrial fibrillation.  He has Lovenox bridge for the session.  Past Medical History:  Diagnosis Date   Arthritis    Atrial fibrillation (HCC)    DVT (deep venous thrombosis) (HCC)    Dyspnea    Dysrhythmia    GERD (gastroesophageal reflux disease)    History of kidney stones    Hypertension    Pancreatic mass    Pulmonary embolism (HCC)    Renal disorder    kidney stones   Stroke (HCC)    TIA (transient ischemic attack)    Tobacco use     Past Surgical History:  Procedure Laterality Date   CATARACT EXTRACTION, BILATERAL     REPLACEMENT TOTAL KNEE Right 2023   URETERAL STENT PLACEMENT Bilateral    VARICOSE VEIN SURGERY Left     Prior to Admission medications   Medication Sig Start Date End Date Taking? Authorizing Provider  albuterol (VENTOLIN HFA) 108 (90 Base) MCG/ACT inhaler Inhale 2 puffs into the lungs every 6 (six) hours as needed for shortness of breath or wheezing. 07/03/19  Yes [provider]  ascorbic acid (VITAMIN C) 500 MG tablet Take 500 mg by mouth daily.   Yes [provider]  b complex vitamins capsule Take 1 capsule by mouth daily.   Yes [provider]  Cholecalciferol (VITAMIN D-3) 5000 units TABS Take 10,000 Units by mouth daily.   Yes [provider]  enoxaparin (LOVENOX) 150 MG/ML injection Inject 1 mL (150 mg total) into the skin every 12 (twelve) hours. As directed on separate Lovenox/surgery instructions provided 03/27/23  Yes Tiffany Kocher, PA-C  furosemide (LASIX) 40 MG tablet Take 1 tablet (40 mg total) by mouth daily. 04/19/23  Yes BranchDorothe Pea, MD  lisinopril (ZESTRIL) 5 MG tablet Take 5 mg by mouth daily as needed (SBP > 140).    Yes [provider]  magnesium hydroxide (MILK OF MAGNESIA) 400 MG/5ML suspension Take 15 mLs by mouth daily as needed for mild constipation.   Yes [provider]  ondansetron (ZOFRAN) 4 MG tablet Take 4 mg by mouth daily. 08/25/21  Yes [provider]  potassium chloride SA (KLOR-CON M) 20 MEQ tablet Take 1 tablet (20 mEq total) by mouth daily. 04/19/23  Yes BranchDorothe Pea, MD  vitamin B-12 (CYANOCOBALAMIN) 500 MCG tablet Take 500 mcg by mouth daily.   Yes [provider]  warfarin (COUMADIN) 1 MG tablet Take 3 mg by mouth daily.   Yes [provider]  warfarin (COUMADIN) 10 MG tablet Take 10 mg by mouth daily.   Yes [provider]  warfarin (COUMADIN) 3 MG tablet Take 6 mg by mouth daily.   Yes [provider]  zinc gluconate 50 MG tablet Take 50 mg by mouth daily.   Yes [provider]    Allergies as of 03/25/2023 - Review Complete 03/05/2023  Allergen Reaction Noted   Codeine Nausea Only 07/28/2018    Family History  Problem Relation Age of Onset   Glaucoma Mother    Lung cancer Father    Colon cancer Neg Hx     Social History   Socioeconomic History   Marital status: Divorced    Spouse name: Not  on file   Number of children: Not on file   Years of education: Not on file   Highest education level: Not on file  Occupational History   Not on file  Tobacco Use   Smoking status: Every Day    Packs/day: .25    Types: Cigarettes   Smokeless tobacco: Never  Vaping Use   Vaping Use: Never used  Substance and Sexual Activity   Alcohol use: Not Currently    Comment: ocassionally   Drug use: Never   Sexual activity: Not Currently  Other Topics Concern   Not on file  Social History Narrative   Not on file   Social Determinants of Health   Financial Resource Strain: Not on file  Food Insecurity: Not on file  Transportation Needs: Not on file  Physical Activity: Not on file  Stress: Not on file   Social Connections: Not on file  Intimate Partner Violence: Not on file    Review of Systems: See HPI, otherwise negative ROS  Physical Exam: BP 133/62 (BP Location: Left Arm)   Pulse 72   Temp 97.6 F (36.4 C)   SpO2 98%  General:   Alert,  Well-developed, well-nourished, pleasant and cooperative in NAD Neck:  Supple; no masses or thyromegaly. No significant cervical adenopathy. Lungs:  Clear throughout to auscultation.   No wheezes, crackles, or rhonchi. No acute distress. Heart:  Regular rate and rhythm; no murmurs, clicks, rubs,  or gallops. Abdomen: Non-distended, normal bowel sounds.  Soft and nontender without appreciable mass or hepatosplenomegaly.   Impression/Plan:    Chronic diarrhea in a 67 year old here for first ever colonoscopy.  Coumadin held bridging with Lovenox. The risks, benefits, limitations, alternatives and imponderables have been reviewed with the patient. Questions have been answered. All parties are agreeable.       Notice: This dictation was prepared with Dragon dictation along with smaller phrase technology. Any transcriptional errors that result from this process are unintentional and may not be corrected upon review.

## 2023-04-23 NOTE — Op Note (Signed)
[facility] Patient Name: Roger Phelps Procedure Date: 04/23/2023 11:34 AM MRN: [id number] Date of Birth: 27-Sep-1956 Attending MD: Gennette Pac , MD, [id number] CSN: [id number] Age: 67 Admit Type: Outpatient Procedure:                Colonoscopy Indications:              Chronic diarrhea Providers:                Gennette Pac, MD, Edrick Kins, RN, Pandora Leiter, Technician Referring MD:              Medicines:                Propofol per Anesthesia Complications:            No immediate complications. Estimated Blood Loss:     Estimated blood loss was minimal. Procedure:                Pre-Anesthesia Assessment:                           - Prior to the procedure, a History and Physical                            was performed, and patient medications and                            allergies were reviewed. The patient's tolerance of                            previous anesthesia was also reviewed. The risks                            and benefits of the procedure and the sedation                            options and risks were discussed with the patient.                            All questions were answered, and informed consent                            was obtained. Prior Anticoagulants: The patient                            last took Coumadin (warfarin) 3 days and Lovenox                            (enoxaparin) 1 day prior to the procedure. ASA                            Grade Assessment: III - A patient with severe  systemic disease. After reviewing the risks and                            benefits, the patient was deemed in satisfactory                            condition to undergo the procedure.                           After obtaining informed consent, the colonoscope                            was passed under direct vision. Throughout the                            procedure, the patient's blood  pressure, pulse, and                            oxygen saturations were monitored continuously. The                            [id number]) scope was introduced through the                            anus and advanced to the the cecum, identified by                            appendiceal orifice and ileocecal valve. The                            colonoscopy was performed without difficulty. The                            patient tolerated the procedure well. The quality                            of the bowel preparation was adequate. The                            ileocecal valve, appendiceal orifice, and rectum                            were photographed. Scope In: 12:46:21 PM Scope Out: 1:06:46 PM Scope Withdrawal Time: 0 hours 10 minutes 49 seconds  Total Procedure Duration: 0 hours 20 minutes 25 seconds  Findings:      The perianal and digital rectal examinations were normal. Elongated and       redundant colon requiring changing of the patient's position and       external abdominal pressure to reach the cecum.      A 5 mm polyp was found in the mid sigmoid colon. The polyp was       semi-pedunculated. The polyp was removed with a cold snare. Resection       and retrieval were complete. Estimated blood loss was minimal. Finally,  biopsies of right and left colon taken to evaluate for chronic diarrhea. Impression:               - One 5 mm polyp in the mid sigmoid colon, removed                            with a cold snare. Resected and retrieved. Status                            post sigmoid biopsy. Redundant and elongated colon. Moderate Sedation:      Moderate (conscious) sedation was personally administered by an       anesthesia professional. The following parameters were monitored: oxygen       saturation, heart rate, blood pressure, respiratory rate, EKG, adequacy       of pulmonary ventilation, and response to care. Recommendation:           - Patient has a  contact number available for                            emergencies. The signs and symptoms of potential                            delayed complications were discussed with the                            patient. Return to normal activities tomorrow.                            Written discharge instructions were provided to the                            patient.                           - Advance diet as tolerated. Continue Lovenox until                            told otherwise. Resume Coumadin today. Follow-up on                            pathology. Office visit with [place] in 6 weeks. INR on                            6/7. Procedure Code(s):        --- Professional ---                           [phone], Colonoscopy, flexible; with removal of                            tumor(s), polyp(s), or other lesion(s) by snare                            technique Diagnosis Code(s):        --- Professional ---  D12.5, Benign neoplasm of sigmoid colon                           K52.9, Noninfective gastroenteritis and colitis,                            unspecified CPT copyright 2022 American Medical Association. All rights reserved. The codes documented in this report are preliminary and upon coder review may  be revised to meet current compliance requirements. Gerrit Friends. Charleston Hankin, MD Gennette Pac, MD 04/23/2023 1:17:28 PM This report has been signed electronically. Number of Addenda: 0

## 2023-04-23 NOTE — Anesthesia Preprocedure Evaluation (Signed)
Anesthesia Evaluation  Patient identified by MRN, date of birth, ID band Patient awake    Reviewed: Allergy & Precautions, H&P , NPO status , Patient's Chart, lab work & pertinent test results, reviewed documented beta blocker date and time   Airway Mallampati: II  TM Distance: >3 FB Neck ROM: full    Dental no notable dental hx.    Pulmonary neg pulmonary ROS, shortness of breath, Current Smoker   Pulmonary exam normal breath sounds clear to auscultation       Cardiovascular Exercise Tolerance: Good hypertension, negative cardio ROS + dysrhythmias Atrial Fibrillation  Rhythm:regular Rate:Normal     Neuro/Psych TIACVA negative neurological ROS  negative psych ROS   GI/Hepatic negative GI ROS, Neg liver ROS,GERD  ,,  Endo/Other  negative endocrine ROS    Renal/GU Renal diseasenegative Renal ROS  negative genitourinary   Musculoskeletal   Abdominal   Peds  Hematology negative hematology ROS (+)   Anesthesia Other Findings   Reproductive/Obstetrics negative OB ROS                             Anesthesia Physical Anesthesia Plan  ASA: 3  Anesthesia Plan: General   Post-op Pain Management:    Induction:   PONV Risk Score and Plan: Propofol infusion  Airway Management Planned:   Additional Equipment:   Intra-op Plan:   Post-operative Plan:   Informed Consent: I have reviewed the patients History and Physical, chart, labs and discussed the procedure including the risks, benefits and alternatives for the proposed anesthesia with the patient or authorized representative who has indicated his/her understanding and acceptance.     Dental Advisory Given  Plan Discussed with: CRNA  Anesthesia Plan Comments:        Anesthesia Quick Evaluation

## 2023-04-23 NOTE — Discharge Instructions (Signed)
  Colonoscopy Discharge Instructions  Read the instructions outlined below and refer to this sheet in the next few weeks. These discharge instructions provide you with general information on caring for yourself after you leave the hospital. Your doctor may also give you specific instructions. While your treatment has been planned according to the most current medical practices available, unavoidable complications occasionally occur. If you have any problems or questions after discharge, call Dr. Jena Gauss at [phone]. ACTIVITY You may resume your regular activity, but move at a slower pace for the next 24 hours.  Take frequent rest periods for the next 24 hours.  Walking will help get rid of the air and reduce the bloated feeling in your belly (abdomen).  No driving for 24 hours (because of the medicine (anesthesia) used during the test).   Do not sign any important legal documents or operate any machinery for 24 hours (because of the anesthesia used during the test).  NUTRITION Drink plenty of fluids.  You may resume your normal diet as instructed by your doctor.  Begin with a light meal and progress to your normal diet. Heavy or fried foods are harder to digest and may make you feel sick to your stomach (nauseated).  Avoid alcoholic beverages for 24 hours or as instructed.  MEDICATIONS You may resume your normal medications unless your doctor tells you otherwise.  WHAT YOU CAN EXPECT TODAY Some feelings of bloating in the abdomen.  Passage of more gas than usual.  Spotting of blood in your stool or on the toilet paper.  IF YOU HAD POLYPS REMOVED DURING THE COLONOSCOPY: No aspirin products for 7 days or as instructed.  No alcohol for 7 days or as instructed.  Eat a soft diet for the next 24 hours.  FINDING OUT THE RESULTS OF YOUR TEST Not all test results are available during your visit. If your test results are not back during the visit, make an appointment with your caregiver to find out the  results. Do not assume everything is normal if you have not heard from your caregiver or the medical facility. It is important for you to follow up on all of your test results.  SEEK IMMEDIATE MEDICAL ATTENTION IF: You have more than a spotting of blood in your stool.  Your belly is swollen (abdominal distention).  You are nauseated or vomiting.  You have a temperature over 101.  You have abdominal pain or discomfort that is severe or gets worse throughout the day.       1 polyp found in your colon and removed     samples of your colon were taken to evaluate for diarrhea.       Further recommendations to follow pending review of pathology report     office visit with Tana Coast in 6 weeks      resume Coumadin today.        Continue Lovenox 150 mg SQ twice daily until told otherwise.        INR on  June 7.  Further recommendations to follow.

## 2023-04-24 ENCOUNTER — Ambulatory Visit: Payer: Medicare PPO | Admitting: Cardiology

## 2023-04-24 ENCOUNTER — Encounter: Payer: Self-pay | Admitting: Internal Medicine

## 2023-04-24 LAB — SURGICAL PATHOLOGY

## 2023-04-29 ENCOUNTER — Ambulatory Visit: Payer: Medicare PPO | Attending: Cardiology | Admitting: *Deleted

## 2023-04-29 ENCOUNTER — Telehealth: Payer: Self-pay | Admitting: Internal Medicine

## 2023-04-29 DIAGNOSIS — Z8673 Personal history of transient ischemic attack (TIA), and cerebral infarction without residual deficits: Secondary | ICD-10-CM

## 2023-04-29 DIAGNOSIS — I4891 Unspecified atrial fibrillation: Secondary | ICD-10-CM

## 2023-04-29 DIAGNOSIS — Z5181 Encounter for therapeutic drug level monitoring: Secondary | ICD-10-CM | POA: Diagnosis not present

## 2023-04-29 LAB — POCT INR: INR: 3.9 — AB (ref 2.0–3.0)

## 2023-04-29 NOTE — Telephone Encounter (Signed)
Go ahead and see if we can get him in sooner with an app that he has seen in the past and keep the follow up with LSL. We can cancel at the time of the appt if he doesn't need to be seen again.

## 2023-04-29 NOTE — Patient Instructions (Signed)
Has 10mg , 3mg  and 1mg  tablets Has been taking 19mg  of warfarin daily: 10mg  + 6mg  + 3mg  Hold warfarin tonight then decrease dose to 15mg  daily (TAKE 1 1/2 OF THE 10MG  TABLETS)

## 2023-04-29 NOTE — Telephone Encounter (Signed)
Pt had procedure by [facility] and is scheduled for a pp fu in 6 weeks with LSL. Patient sent a MyChart message that his diarrhea has came back. Please advise. [phone] Pt is scheduled with LSL on 7/12 at 10am.

## 2023-04-29 NOTE — Telephone Encounter (Signed)
Sent patient a MyChart message and also LMOM if he could come see LSL on June 24 at 8am. I didn't cancel the July appointment.

## 2023-05-01 ENCOUNTER — Encounter (HOSPITAL_COMMUNITY): Payer: Self-pay | Admitting: Internal Medicine

## 2023-05-08 ENCOUNTER — Ambulatory Visit: Payer: Medicare PPO | Attending: Internal Medicine | Admitting: *Deleted

## 2023-05-08 DIAGNOSIS — Z8673 Personal history of transient ischemic attack (TIA), and cerebral infarction without residual deficits: Secondary | ICD-10-CM

## 2023-05-08 DIAGNOSIS — I4891 Unspecified atrial fibrillation: Secondary | ICD-10-CM

## 2023-05-08 DIAGNOSIS — Z5181 Encounter for therapeutic drug level monitoring: Secondary | ICD-10-CM

## 2023-05-08 LAB — POCT INR: INR: 1.4 — AB (ref 2.0–3.0)

## 2023-05-08 NOTE — Patient Instructions (Signed)
Has 10mg , 3mg  and 1mg  tablets.  Previously managed by [facility]. Was taking 19mg  of warfarin daily: 10mg  + 6mg  + 3mg  before Take warfarin 2 tablets tonight then increase dose to 15 mg daily except 20mg  on Monday, Wednesdays and Fridays

## 2023-05-13 ENCOUNTER — Encounter: Payer: Self-pay | Admitting: Gastroenterology

## 2023-05-13 ENCOUNTER — Ambulatory Visit (INDEPENDENT_AMBULATORY_CARE_PROVIDER_SITE_OTHER): Payer: Medicare PPO | Admitting: Gastroenterology

## 2023-05-13 VITALS — BP 119/79 | HR 71 | Temp 97.7°F | Ht 78.0 in

## 2023-05-13 DIAGNOSIS — K529 Noninfective gastroenteritis and colitis, unspecified: Secondary | ICD-10-CM | POA: Diagnosis not present

## 2023-05-13 DIAGNOSIS — K8689 Other specified diseases of pancreas: Secondary | ICD-10-CM | POA: Diagnosis not present

## 2023-05-13 NOTE — Patient Instructions (Signed)
Complete stool tests at [facility]. [address], [place]. Once results are back we will decide about imaging of her pancreas.

## 2023-05-13 NOTE — Progress Notes (Signed)
GI Office Note    Referring Provider: Wilmon Pali, FNP Primary Care Physician:  Wilmon Pali, FNP  Primary Gastroenterologist: Roetta Sessions, MD   Chief Complaint   Chief Complaint  Patient presents with   Follow-up    History of Present Illness   Roger Phelps is a 67 y.o. male presenting today for follow up. Seen 02/2023 for chronic diarrhea and due for colonoscopy.   Diarrhea seemed to start after new medication for osteoporosis back in 10/2022. Ultimately he did not tolerate medication. Now on Reclast once yearly. Still wonders if diarrhea related to osteoporosis medication.  He had unremarkable stool studies (Cdiff GDH Ag was neg, stool culture neg). Labs from 02/2023: TTG IgA, serum IgA normal, TSH/free T4 normal, CBC, normal, Lipase normal.   Patient gives a history of abnormal pancreas noted on CT in 2019. States has never had anything done about it. 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    Today: Stools are watery. No solid stools. Some fecal incontinence. No melena, brbpr. Urgency. No abdominal cramping. No specific food triggers. Has tried [place] and does not work. No heartburn. Sometimes feels like food doesn't go well down well if spicy/greasy foods.  Used to be on reflux medication but symptoms got better so he stopped it.  Days he is having watery stools, can have 5-7 stools per day.  The symptoms can last for a week at a time and then he may have 1 day where he has a little less frequent.  He has tried Imodium in the past without results.  Colonoscopy 04/2023: -one 5mm polyp in mid sigmoid colon removed, tubular adenoma -redundant and elongated colon -random colon biopsies negative -next colonoscopy 7 years   Medications   Current Outpatient Medications  Medication Sig Dispense Refill   albuterol (VENTOLIN HFA) 108 (90 Base) MCG/ACT inhaler Inhale  2 puffs into the lungs every 6 (six) hours as needed for shortness of breath or wheezing.     ascorbic acid (VITAMIN C) 500 MG tablet Take 500 mg by mouth daily.     b complex vitamins capsule Take 1 capsule by mouth daily.     Cholecalciferol (VITAMIN D-3) 5000 units TABS Take 10,000 Units by mouth daily.     furosemide (LASIX) 40 MG tablet Take 1 tablet (40 mg total) by mouth daily. 90 tablet 1   lisinopril (ZESTRIL) 5 MG tablet Take 5 mg by mouth daily as needed (SBP > 140).     magnesium hydroxide (MILK OF MAGNESIA) 400 MG/5ML suspension Take 15 mLs by mouth daily as needed for mild constipation.     ondansetron (ZOFRAN) 4 MG tablet Take 4 mg by mouth daily.     potassium chloride SA (KLOR-CON M) 20 MEQ tablet Take 1 tablet (20 mEq total) by mouth daily. 90 tablet 1   vitamin B-12 (CYANOCOBALAMIN) 500 MCG tablet Take 500 mcg by mouth daily.     warfarin (COUMADIN) 1 MG tablet Take 3 mg by mouth daily.     warfarin (COUMADIN) 10 MG tablet Take 10 mg by mouth daily.     warfarin (COUMADIN) 3 MG tablet Take 6 mg by mouth daily.     zinc gluconate 50 MG tablet Take 50 mg by mouth daily.     No current facility-administered medications for this visit.    Allergies   Allergies as  of 05/13/2023 - Review Complete 05/13/2023  Allergen Reaction Noted   Codeine Nausea Only 07/28/2018      Review of Systems   General: Negative for anorexia, weight loss, fever, chills, fatigue, weakness. ENT: Negative for hoarseness, difficulty swallowing , nasal congestion. CV: Negative for chest pain, angina, palpitations, dyspnea on exertion, peripheral edema.  Respiratory: Negative for dyspnea at rest, dyspnea on exertion, cough, sputum, wheezing.  GI: See history of present illness. GU:  Negative for dysuria, hematuria, urinary incontinence, urinary frequency, nocturnal urination.  Endo: Negative for unusual weight change.     Physical Exam   BP 119/79 (BP Location: Left Arm, Patient Position:  Sitting, Cuff Size: Large)   Pulse 71   Temp 97.7 F (36.5 C) (Oral)   Ht 6\' 6"  (1.981 m)   SpO2 97%   BMI 42.50 kg/m    General: Well-nourished, well-developed in no acute distress.  Presents in wheelchair.  Uses chair for longer distances. Eyes: No icterus. Mouth: Oropharyngeal mucosa moist and pink , no lesions erythema or exudate. Lungs: Clear to auscultation bilaterally.  Heart: Regular rate and rhythm, no murmurs rubs or gallops.  Abdomen: Bowel sounds are normal,  nondistended, no hepatosplenomegaly or masses, no abdominal bruits or hernia , no rebound or guarding.  Some mild right-sided tenderness to light palpation more in the lower abdomen. Rectal: Not performed Extremities: No lower extremity edema. No clubbing or deformities. Neuro: Alert and oriented x 4   Skin: Warm and dry, no jaundice.   Psych: Alert and cooperative, normal mood and affect.  Labs   Lab Results  Component Value Date   LIPASE 44 03/08/2023   Lab Results  Component Value Date   WBC 6.8 03/08/2023   HGB 15.2 03/08/2023   HCT 46.9 03/08/2023   MCV 91 03/08/2023   PLT 180 03/08/2023   Lab Results  Component Value Date   TSH 2.840 03/08/2023   Lab Results  Component Value Date   CREATININE 0.68 04/23/2023   BUN 8 04/23/2023   NA 135 04/23/2023   K 3.8 04/23/2023   CL 104 04/23/2023   CO2 22 04/23/2023    Imaging Studies   No results found.  Assessment   Chronic diarrhea: Prior stool culture, C. difficile GDH negative.  Celiac screen negative.  Random colon biopsies negative. Etiology not clear as to cause of his bowel change/diarrhea. Mild right lower abdominal tenderness with light palpation. May be musculoskeletal. Can evaluate via CT at time of evaluating pancreas.  Pancreatic mass: possible lipoma on 2019 noncontrast CT. Recommended MRI at that time but this was not completed. He is not sure he could tolerate laying flat for MRI. If imaging needed, consider updated CT with  contrast.   GERD: occasional dysphagia with greasy/spicy foods. ?reflux rather than stricture. Consider trial of PPI initially. Will discuss with patient when stool results return.   PLAN   Consider additional imaging of pancreas. Patient wanted to wait until stool tests result. Stool for GI profile, pancreatic elastase.  Start PPI in near future. Will discuss when address stool results.    Leanna Battles. Melvyn Neth, MHS, PA-C [facility]

## 2023-05-20 ENCOUNTER — Ambulatory Visit: Payer: Medicare PPO | Attending: Internal Medicine | Admitting: *Deleted

## 2023-05-20 DIAGNOSIS — Z5181 Encounter for therapeutic drug level monitoring: Secondary | ICD-10-CM

## 2023-05-20 DIAGNOSIS — Z8673 Personal history of transient ischemic attack (TIA), and cerebral infarction without residual deficits: Secondary | ICD-10-CM | POA: Diagnosis not present

## 2023-05-20 DIAGNOSIS — I4891 Unspecified atrial fibrillation: Secondary | ICD-10-CM

## 2023-05-20 LAB — POCT INR: INR: 3.1 — AB (ref 2.0–3.0)

## 2023-05-20 NOTE — Patient Instructions (Signed)
Decrease warfarin to 15 mg daily except 20mg  on Tuesdays and Fridays Recheck in 2 wks

## 2023-05-21 LAB — GI PROFILE, STOOL, PCR

## 2023-05-27 ENCOUNTER — Telehealth: Payer: Self-pay

## 2023-05-27 LAB — PANCREATIC ELASTASE, FECAL: Pancreatic Elastase, Fecal: >800 ug Elast./g (ref >200)

## 2023-05-27 NOTE — Telephone Encounter (Signed)
Pt called wanting to know the result/recommendations for the fecal elastase. Pt seen results in myChart. Please advise.

## 2023-05-29 NOTE — Telephone Encounter (Signed)
See result note.  

## 2023-05-30 ENCOUNTER — Encounter: Payer: Self-pay | Admitting: *Deleted

## 2023-05-30 ENCOUNTER — Other Ambulatory Visit: Payer: Self-pay | Admitting: Gastroenterology

## 2023-05-30 ENCOUNTER — Other Ambulatory Visit: Payer: Self-pay | Admitting: *Deleted

## 2023-05-30 DIAGNOSIS — R1031 Right lower quadrant pain: Secondary | ICD-10-CM

## 2023-05-30 DIAGNOSIS — K8689 Other specified diseases of pancreas: Secondary | ICD-10-CM

## 2023-05-30 MED ORDER — PANTOPRAZOLE SODIUM 40 MG PO TBEC: 40.0000 mg | DELAYED_RELEASE_TABLET | Freq: Every day | ORAL | 5 refills | Status: DC

## 2023-05-30 MED ORDER — DICYCLOMINE HCL 10 MG PO CAPS: 10.0000 mg | ORAL_CAPSULE | Freq: Two times a day (BID) | ORAL | 3 refills | Status: DC | PRN

## 2023-05-31 ENCOUNTER — Ambulatory Visit: Payer: Medicare PPO | Admitting: Gastroenterology

## 2023-06-03 ENCOUNTER — Ambulatory Visit: Payer: Medicare PPO

## 2023-06-03 ENCOUNTER — Ambulatory Visit: Payer: Medicare PPO | Attending: Internal Medicine | Admitting: *Deleted

## 2023-06-03 ENCOUNTER — Telehealth: Payer: Self-pay

## 2023-06-03 DIAGNOSIS — Z5181 Encounter for therapeutic drug level monitoring: Secondary | ICD-10-CM | POA: Diagnosis not present

## 2023-06-03 DIAGNOSIS — I4891 Unspecified atrial fibrillation: Secondary | ICD-10-CM

## 2023-06-03 DIAGNOSIS — Z8673 Personal history of transient ischemic attack (TIA), and cerebral infarction without residual deficits: Secondary | ICD-10-CM

## 2023-06-03 LAB — POCT INR: INR: 1.3 — AB (ref 2.0–3.0)

## 2023-06-03 MED ORDER — HYOSCYAMINE SULFATE 0.125 MG SL SUBL: 0.1250 mg | SUBLINGUAL_TABLET | Freq: Three times a day (TID) | SUBLINGUAL | 0 refills | Status: DC | PRN

## 2023-06-03 MED ORDER — WARFARIN SODIUM 10 MG PO TABS: ORAL_TABLET | ORAL | 1 refills | Status: DC

## 2023-06-03 NOTE — Patient Instructions (Signed)
Take warfarin 25mg  today and tomorrow then continue 15 mg daily except 20mg  on Tuesdays and Fridays Recheck in 2 wks

## 2023-06-03 NOTE — Telephone Encounter (Signed)
Pt called stating that the dicyclomine doesn't help and that it actually made his stomach hurt. Pt states that he took it for two days. Please advise.

## 2023-06-03 NOTE — Telephone Encounter (Signed)
Pt was made aware and verbalized understanding.  

## 2023-06-03 NOTE — Addendum Note (Signed)
Addended by: Tiffany Kocher on: 06/03/2023 12:56 PM   Modules accepted: Orders

## 2023-06-03 NOTE — Telephone Encounter (Signed)
Please have him stop bentyl. If his abdominal pain does not improve, he should let us know.   If he is still having diarrhea, we can try Levsin.   Await CT, scheduled for end of month, if worsening abdominal pain, may need to get done at another site to speed up process.

## 2023-06-04 LAB — ECHOCARDIOGRAM COMPLETE
AR max vel: 2.5 cm2
AV Area VTI: 3.21 cm2
AV Area mean vel: 2.74 cm2
AV Mean grad: 5 mmHg
AV Peak grad: 9.5 mmHg
Ao pk vel: 1.54 m/s
Area-P 1/2: 4.31 cm2
Calc EF: 52.6 %
MV VTI: 2.32 cm2
S' Lateral: 3.3 cm
Single Plane A2C EF: 44.9 %
Single Plane A4C EF: 56.7 %

## 2023-06-07 ENCOUNTER — Ambulatory Visit: Payer: Medicare PPO | Admitting: Gastroenterology

## 2023-06-17 ENCOUNTER — Ambulatory Visit (HOSPITAL_COMMUNITY)
Admission: RE | Admit: 2023-06-17 | Discharge: 2023-06-17 | Disposition: A | Discharge status: Home / Self Care | Payer: Medicare PPO | Source: Ambulatory Visit | Attending: Gastroenterology | Admitting: Gastroenterology

## 2023-06-17 DIAGNOSIS — K8689 Other specified diseases of pancreas: Secondary | ICD-10-CM | POA: Insufficient documentation

## 2023-06-17 DIAGNOSIS — R1031 Right lower quadrant pain: Secondary | ICD-10-CM | POA: Insufficient documentation

## 2023-06-17 MED ORDER — IOHEXOL 300 MG/ML  SOLN
100.0000 mL | Freq: Once | INTRAMUSCULAR | Status: AC | PRN
  Administered 2023-06-17: 100 mL via INTRAVENOUS

## 2023-06-18 ENCOUNTER — Ambulatory Visit: Payer: Medicare PPO | Attending: Internal Medicine | Admitting: *Deleted

## 2023-06-18 DIAGNOSIS — Z8673 Personal history of transient ischemic attack (TIA), and cerebral infarction without residual deficits: Secondary | ICD-10-CM | POA: Diagnosis not present

## 2023-06-18 DIAGNOSIS — Z5181 Encounter for therapeutic drug level monitoring: Secondary | ICD-10-CM

## 2023-06-18 DIAGNOSIS — I4891 Unspecified atrial fibrillation: Secondary | ICD-10-CM

## 2023-06-18 LAB — POCT INR: INR: 3.8 — AB (ref 2.0–3.0)

## 2023-06-18 NOTE — Patient Instructions (Signed)
Hold warfarin tonight then decrease dose to 15 mg daily  Recheck in 2 wks

## 2023-06-25 ENCOUNTER — Telehealth: Payer: Self-pay

## 2023-06-25 NOTE — Telephone Encounter (Signed)
Pt has seen his results to his CT on myChart and is wanting to know your thoughts on it. Explained to pt that you have 5 business days to get results to him, pt verbalized understanding.

## 2023-06-25 NOTE — Telephone Encounter (Signed)
Tammy, please tell patient I'm sorry for delay on CT report. Even though he had CT on 7/29, it did not result until 06/24/23 (yesterday). I have sent you a result note.

## 2023-06-26 NOTE — Telephone Encounter (Signed)
noted 

## 2023-06-27 ENCOUNTER — Telehealth: Payer: Self-pay | Admitting: Internal Medicine

## 2023-06-27 NOTE — Telephone Encounter (Signed)
Patient stated he recently had CT Scan and it showed his heart was enlarge. Patient stated the doctor who ordered the scan suggested he tell Dr. Jenene Slicker.

## 2023-06-27 NOTE — Telephone Encounter (Signed)
Will send to provider for review .

## 2023-06-28 ENCOUNTER — Ambulatory Visit: Payer: Medicare PPO | Admitting: Internal Medicine

## 2023-06-28 ENCOUNTER — Encounter: Payer: Self-pay | Admitting: Internal Medicine

## 2023-06-28 VITALS — BP 124/80 | HR 71 | Temp 97.5°F | Ht 78.0 in | Wt 369.0 lb

## 2023-06-28 DIAGNOSIS — K529 Noninfective gastroenteritis and colitis, unspecified: Secondary | ICD-10-CM | POA: Diagnosis not present

## 2023-06-28 NOTE — Progress Notes (Unsigned)
Primary Care Physician:  Wilmon Pali, FNP Primary Gastroenterologist:  Dr. Jena Gauss  Pre-Procedure History & Physical: HPI:  Roger Phelps is a 67 y.o. male here for for follow-up of watery stools.  Morbidly obese patient debilitated since right knee replacement last year.  States she got a parenteral dose of medication for osteoporosis and that is when his bowel function change from a regular bowel movement daily to every other day to watery bowel movements.  He states he has watery bowel movements daily.  Gets abdominal bloating feels he does not have a good bowel movement in 3 to 4 days and then takes OTC laxatives has more substance in the stool but he still describes stools as loose.  GIP/C. difficile negative.  Recent colonoscopy demonstrated elongated redundant colon 1 adenoma removed; biopsies negative for microscopic colitis.  Due for surveillance 7 years from now. States when he takes Levsin this hurts his stomach.  Also, CT revealed stable fatty appearing mass around the uncinate process most consistent with lipoma-stable.  Enlarged heart.  Patient referred to cardiology.  Has not seen yet.  Past Medical History:  Diagnosis Date   Arthritis    Atrial fibrillation (HCC)    DVT (deep venous thrombosis) (HCC)    Dyspnea    Dysrhythmia    GERD (gastroesophageal reflux disease)    History of kidney stones    Hypertension    Pancreatic mass    Pulmonary embolism (HCC)    Renal disorder    kidney stones   Stroke [facility])    TIA (transient ischemic attack)    Tobacco use     Past Surgical History:  Procedure Laterality Date   BIOPSY  04/23/2023   Procedure: BIOPSY;  Surgeon: Corbin Ade, MD;  Location: [facility];  Service: Endoscopy;;   CATARACT EXTRACTION, BILATERAL     COLONOSCOPY WITH PROPOFOL N/A 04/23/2023   Procedure: COLONOSCOPY WITH PROPOFOL;  Surgeon: Corbin Ade, MD;  Location: [facility];  Service: Endoscopy;  Laterality: N/A;  1:00 PM, ASA 3    POLYPECTOMY  04/23/2023   Procedure: POLYPECTOMY;  Surgeon: Corbin Ade, MD;  Location: [facility];  Service: Endoscopy;;   REPLACEMENT TOTAL KNEE Right 2023   URETERAL STENT PLACEMENT Bilateral    VARICOSE VEIN SURGERY Left     Prior to Admission medications   Medication Sig Start Date End Date Taking? Authorizing Provider  albuterol (VENTOLIN HFA) 108 (90 Base) MCG/ACT inhaler Inhale 2 puffs into the lungs every 6 (six) hours as needed for shortness of breath or wheezing. 07/03/19  Yes [provider]  ascorbic acid (VITAMIN C) 500 MG tablet Take 500 mg by mouth daily.   Yes [provider]  b complex vitamins capsule Take 1 capsule by mouth daily.   Yes [provider]  Cholecalciferol (VITAMIN D-3) 5000 units TABS Take 10,000 Units by mouth daily.   Yes [provider]  furosemide (LASIX) 40 MG tablet Take 1 tablet (40 mg total) by mouth daily. 04/19/23  Yes Branch, Dorothe Pea, MD  hyoscyamine (LEVSIN/SL) 0.125 MG SL tablet Place 1 tablet (0.125 mg total) under the tongue 3 (three) times daily as needed for cramping (diarrhea). 06/03/23  Yes Tiffany Kocher, PA-C  ondansetron (ZOFRAN) 4 MG tablet Take 4 mg by mouth daily. 08/25/21  Yes [provider]  pantoprazole (PROTONIX) 40 MG tablet Take 1 tablet (40 mg total) by mouth daily before breakfast. 05/30/23 05/29/24 Yes Tana Coast  S, PA-C  potassium chloride SA (KLOR-CON M) 20 MEQ tablet Take 1 tablet (20 mEq total) by mouth daily. 04/19/23  Yes BranchDorothe Pea, MD  vitamin B-12 (CYANOCOBALAMIN) 500 MCG tablet Take 500 mcg by mouth daily.   Yes [provider]  warfarin (COUMADIN) 10 MG tablet Take 1 - 2 tablets daily as directed by coumadin clinic 06/03/23  Yes Mallipeddi, Vishnu P, MD  zinc gluconate 50 MG tablet Take 50 mg by mouth daily.   Yes [provider]  lisinopril (ZESTRIL) 5 MG tablet Take 5 mg by mouth daily as needed (SBP > 140). Patient not taking: Reported on  06/28/2023    [provider]  magnesium hydroxide (MILK OF MAGNESIA) 400 MG/5ML suspension Take 15 mLs by mouth daily as needed for mild constipation. Patient not taking: Reported on 06/28/2023    [provider]    Allergies as of 06/28/2023 - Review Complete 06/28/2023  Allergen Reaction Noted   Codeine Nausea Only 07/28/2018    Family History  Problem Relation Age of Onset   Glaucoma Mother    Lung cancer Father    Colon cancer Neg Hx     Social History   Socioeconomic History   Marital status: Divorced    Spouse name: Not on file   Number of children: Not on file   Years of education: Not on file   Highest education level: Not on file  Occupational History   Not on file  Tobacco Use   Smoking status: Every Day    Current packs/day: 0.25    Types: Cigarettes   Smokeless tobacco: Never  Vaping Use   Vaping status: Never Used  Substance and Sexual Activity   Alcohol use: Not Currently    Comment: ocassionally   Drug use: Never   Sexual activity: Not Currently  Other Topics Concern   Not on file  Social History Narrative   Not on file   Social Determinants of Health   Financial Resource Strain: Not on file  Food Insecurity: Low Risk  (01/22/2023)   [facility]   Food vital sign    Within the past 12 months, you worried that your food would run out before you got money to buy more: Never true    Within the past 12 months, the food you bought just didn't last and you didn't have money to get more. : Never true  Transportation Needs: Unmet Transportation Needs (01/22/2023)   [facility]   Transportation    In the past 12 months, has lack of reliable transportation kept you from medical appointments, meetings, work or from getting things needed for daily living? : Yes  Physical Activity: Not on file  Stress: Not on file  Social Connections: Not on file  Intimate Partner Violence: Not on  file    Review of Systems: See HPI, otherwise negative ROS  Physical Exam: BP 124/80 (BP Location: Left Arm, Patient Position: Sitting, Cuff Size: Large)   Pulse 71   Temp (!) 97.5 F (36.4 C) (Oral)   Ht 6\' 6"  (1.981 m)   Wt (!) 369 lb (167.4 kg) Comment: pt reported, unable to weigh  SpO2 95%   BMI 42.64 kg/m  General:   Alert, alert conversant.  Confined to a wheelchair.  Accompanied by his sister.   licks, rubs,  or gallops. Abdomen: Massively obese.  Soft and nontender.   Impression/Plan:  ***     Notice:  This dictation was prepared with Dragon dictation along with smaller phrase technology. Any transcriptional errors that result from this process are unintentional and may not be corrected upon review.

## 2023-06-28 NOTE — Patient Instructions (Signed)
It was good to see you again today!  As discussed, it appears even though you are passing liquid stool that you are constipated having to take a laxative every 3 to 4 days.  CAT scan indicate you have a large amount of stool in your colon.  It is possible that your change in bowel habits is a side effect of medication for osteoporosis.  I recommend we try and establish better bowel function by giving you a medication which will empty your colon out better.  That is where the Linzess comes in.  Take Linzess 145 daily for 3 weeks samples provided and call us in 3 weeks and let us know how you are doing and we will go from there.  Plans are subject to change depending on your clinical response.

## 2023-07-01 NOTE — Telephone Encounter (Signed)
Per DPR, left Dr.Mallipiddi's response on his cell and asked him to call back for any further questions.

## 2023-07-02 ENCOUNTER — Ambulatory Visit: Payer: Medicare PPO | Attending: Internal Medicine | Admitting: *Deleted

## 2023-07-02 DIAGNOSIS — Z5181 Encounter for therapeutic drug level monitoring: Secondary | ICD-10-CM

## 2023-07-02 DIAGNOSIS — Z8673 Personal history of transient ischemic attack (TIA), and cerebral infarction without residual deficits: Secondary | ICD-10-CM | POA: Diagnosis not present

## 2023-07-02 DIAGNOSIS — I4891 Unspecified atrial fibrillation: Secondary | ICD-10-CM | POA: Diagnosis not present

## 2023-07-02 LAB — POCT INR: INR: 2.4 (ref 2.0–3.0)

## 2023-07-02 NOTE — Patient Instructions (Signed)
Continue warfarin 15 mg daily  Recheck in 3 wks

## 2023-07-25 ENCOUNTER — Ambulatory Visit: Payer: Medicare PPO | Attending: Internal Medicine | Admitting: *Deleted

## 2023-07-25 DIAGNOSIS — Z5181 Encounter for therapeutic drug level monitoring: Secondary | ICD-10-CM | POA: Diagnosis not present

## 2023-07-25 DIAGNOSIS — I4891 Unspecified atrial fibrillation: Secondary | ICD-10-CM

## 2023-07-25 DIAGNOSIS — Z8673 Personal history of transient ischemic attack (TIA), and cerebral infarction without residual deficits: Secondary | ICD-10-CM

## 2023-07-25 LAB — POCT INR: INR: 2.6 (ref 2.0–3.0)

## 2023-07-25 NOTE — Patient Instructions (Signed)
Continue warfarin 15 mg daily  Recheck in 4 wks

## 2023-07-31 ENCOUNTER — Other Ambulatory Visit: Payer: Self-pay

## 2023-07-31 MED ORDER — LINACLOTIDE 145 MCG PO CAPS: 145.0000 ug | ORAL_CAPSULE | Freq: Every day | ORAL | 0 refills | Status: DC

## 2023-08-01 ENCOUNTER — Encounter: Payer: Self-pay | Admitting: Internal Medicine

## 2023-08-22 ENCOUNTER — Ambulatory Visit: Payer: Medicare PPO | Attending: Internal Medicine | Admitting: *Deleted

## 2023-08-22 DIAGNOSIS — Z8673 Personal history of transient ischemic attack (TIA), and cerebral infarction without residual deficits: Secondary | ICD-10-CM

## 2023-08-22 DIAGNOSIS — I4891 Unspecified atrial fibrillation: Secondary | ICD-10-CM

## 2023-08-22 DIAGNOSIS — Z5181 Encounter for therapeutic drug level monitoring: Secondary | ICD-10-CM | POA: Diagnosis not present

## 2023-08-22 LAB — POCT INR: INR: 4.9 — AB (ref 2.0–3.0)

## 2023-08-22 NOTE — Patient Instructions (Signed)
Hold warfarin tonight, take 1/2 tablet tomorrow then resume 15 mg daily  Recheck in 2 wks

## 2023-09-03 ENCOUNTER — Other Ambulatory Visit: Payer: Self-pay

## 2023-09-03 ENCOUNTER — Ambulatory Visit: Payer: Medicare PPO | Attending: Internal Medicine | Admitting: *Deleted

## 2023-09-03 DIAGNOSIS — I4891 Unspecified atrial fibrillation: Secondary | ICD-10-CM

## 2023-09-03 DIAGNOSIS — G459 Transient cerebral ischemic attack, unspecified: Secondary | ICD-10-CM

## 2023-09-03 DIAGNOSIS — Z5181 Encounter for therapeutic drug level monitoring: Secondary | ICD-10-CM

## 2023-09-03 DIAGNOSIS — Z8673 Personal history of transient ischemic attack (TIA), and cerebral infarction without residual deficits: Secondary | ICD-10-CM

## 2023-09-03 LAB — POCT INR: INR: 3.2 — AB (ref 2.0–3.0)

## 2023-09-03 MED ORDER — LINACLOTIDE 145 MCG PO CAPS: 145.0000 ug | ORAL_CAPSULE | Freq: Every day | ORAL | 6 refills | Status: DC

## 2023-09-03 NOTE — Patient Instructions (Signed)
Take warfarin 1/2 tablet tonight then decrease dose to 15 mg daily except 10mg  on Fridays Recheck in 3 wks

## 2023-09-25 ENCOUNTER — Ambulatory Visit: Payer: Medicare PPO | Attending: Internal Medicine | Admitting: *Deleted

## 2023-09-25 DIAGNOSIS — I4891 Unspecified atrial fibrillation: Secondary | ICD-10-CM

## 2023-09-25 DIAGNOSIS — Z5181 Encounter for therapeutic drug level monitoring: Secondary | ICD-10-CM

## 2023-09-25 DIAGNOSIS — Z8673 Personal history of transient ischemic attack (TIA), and cerebral infarction without residual deficits: Secondary | ICD-10-CM

## 2023-09-25 LAB — POCT INR: INR: 1.8 — AB (ref 2.0–3.0)

## 2023-09-25 NOTE — Patient Instructions (Signed)
Take warfarin 2 tablets tonight then increase dose to 15 mg daily  Recheck in 3 wks

## 2023-10-04 ENCOUNTER — Encounter: Payer: Self-pay | Admitting: Gastroenterology

## 2023-10-04 ENCOUNTER — Ambulatory Visit: Payer: Medicare PPO | Admitting: Gastroenterology

## 2023-10-04 VITALS — BP 121/80 | HR 91 | Temp 97.5°F | Ht 78.0 in | Wt 365.0 lb

## 2023-10-04 DIAGNOSIS — R194 Change in bowel habit: Secondary | ICD-10-CM | POA: Diagnosis not present

## 2023-10-04 DIAGNOSIS — D1779 Benign lipomatous neoplasm of other sites: Secondary | ICD-10-CM | POA: Diagnosis not present

## 2023-10-04 DIAGNOSIS — R140 Abdominal distension (gaseous): Secondary | ICD-10-CM | POA: Diagnosis not present

## 2023-10-04 DIAGNOSIS — K8689 Other specified diseases of pancreas: Secondary | ICD-10-CM

## 2023-10-04 DIAGNOSIS — K529 Noninfective gastroenteritis and colitis, unspecified: Secondary | ICD-10-CM

## 2023-10-04 NOTE — Patient Instructions (Signed)
Glad you are feeling better. Continue to monitor your stools for any recurrent diarrhea. Call with any questions. Return office visit in four months to see Dr. Jena Gauss.

## 2023-10-04 NOTE — Progress Notes (Signed)
GI Office Note    Referring Provider: Wilmon Pali, FNP Primary Care Physician:  Wilmon Pali, FNP  Primary Gastroenterologist: Roetta Sessions, MD   Chief Complaint   Chief Complaint  Patient presents with   Follow-up    States that the last 3 weeks has been good    History of Present Illness   Roger Phelps is a 67 y.o. male presenting today for follow up.   Last seen in August 2024.  History of diarrhea with bowel function change for nearly 1 year.  Diarrhea started after new medication for osteoporosis back in 11/07/2022.  Ultimately he did not tolerate the medication, now on Reclast once yearly.  Still wonders if diarrhea related to osteoporosis medication. Went from daily to every other day regular bowel movement to watery stools 1-2 times daily.  Can also go 3 to 4 days without a stool and takes OTC laxatives. Patient concerned that passing only watery stools at times was due to a blockage. Work up has included negative GI pathogen panel, C. difficile.  Fecal elastase normal.  Recent colonoscopy with elongated redundant colon, single adenoma removed, biopsies negative for microscopic colitis.  Due for surveillance 7 years.  Tried Levsin and bentyl but caused stomach pain.  CT showed fatty appearing mass around the uncinate process most consistent with stable lipoma (stable from 2019), large amount of stool throughout the colon consistent with constipation. Plans for MRI but needs to be scheduled. After last OV, Dr. Jena Gauss advised trial of Linzess 145 mcg daily for 3 weeks to get stool load mobilized given CT findings and patient taking OTC laxatives. He was provided samples of provided, he took two at a time.   Today:  Felt like he was moving bowels very well with Linzess (took two at a time) but stools very watery. RX for did not seem as effective. For past 3 weeks, he has not been on any constipation medications. He is finally having solid stool. Happy  with current bowel habits. No melena, brbpr. No abdominal pain. No n/v. Appetite good.   He states he did not go through with his yearly Reclast dose because he was afraid he would start having diarrhea again. "Could not go through another year like the last one". He also does not want to pursue MRI of pancreas, does not feel like he can lay flat on MRI table. He will let us know if he decides he want future imaging of his pancreas. Follow up CT with contrast (compared to 2019) showed stable lesion over five years, most c/w lipoma. Likely benign.      CT abdomen pelvis with contrast July 2024 IMPRESSION: 1. No acute findings to explain the patient's right lower quadrant pain. Stool in the majority of the colon is indicative of constipation. 2. 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 3. Small to borderline enlarged inguinal lymph nodes, possibly reactive. 4.  Aortic atherosclerosis (ICD10-I70.0).  Colonoscopy 04/2023: -one 5mm polyp in mid sigmoid colon removed, tubular adenoma -redundant and elongated colon -random colon biopsies negative -next colonoscopy 7 years   Medications   Current Outpatient Medications  Medication Sig Dispense Refill   albuterol (VENTOLIN HFA) 108 (90 Base) MCG/ACT inhaler Inhale 2 puffs into the lungs every 6 (six) hours as needed for shortness of breath or wheezing.     ascorbic acid (VITAMIN C) 500 MG tablet Take 500 mg by mouth daily.     b  complex vitamins capsule Take 1 capsule by mouth daily.     Cholecalciferol (VITAMIN D-3) 5000 units TABS Take 10,000 Units by mouth daily.     furosemide (LASIX) 40 MG tablet Take 1 tablet (40 mg total) by mouth daily. 90 tablet 1   hyoscyamine (LEVSIN/SL) 0.125 MG SL tablet Place 1 tablet (0.125 mg total) under the tongue 3 (three) times daily as needed for cramping (diarrhea). 90 tablet 0   ondansetron (ZOFRAN) 4 MG tablet Take 4 mg by mouth daily.     pantoprazole (PROTONIX) 40 MG tablet Take 1  tablet (40 mg total) by mouth daily before breakfast. 30 tablet 5   potassium chloride SA (KLOR-CON M) 20 MEQ tablet Take 1 tablet (20 mEq total) by mouth daily. 90 tablet 1   vitamin B-12 (CYANOCOBALAMIN) 500 MCG tablet Take 500 mcg by mouth daily.     warfarin (COUMADIN) 10 MG tablet Take 1 - 2 tablets daily as directed by coumadin clinic 180 tablet 1   zinc gluconate 50 MG tablet Take 50 mg by mouth daily.     No current facility-administered medications for this visit.    Allergies   Allergies as of 10/04/2023 - Review Complete 10/04/2023  Allergen Reaction Noted   Codeine Nausea Only 07/28/2018      Review of Systems   General: Negative for anorexia, weight loss,  ENT: Negative for hoarseness, difficulty swallowing , nasal congestion. CV: Negative for chest pain, angina, palpitations, dyspnea on exertion, peripheral edema.  Respiratory: Negative for dyspnea at rest, dyspnea on exertion, cough, sputum, wheezing.  GI: See history of present illness. GU:  Negative for dysuria, hematuria, urinary incontinence, urinary frequency, nocturnal urination.  Endo: Negative for unusual weight change.     Physical Exam   BP 121/80 (BP Location: Left Arm, Patient Position: Sitting, Cuff Size: Large)   Pulse 91   Temp (!) 97.5 F (36.4 C) (Oral)   Ht 6\' 6"  (1.981 m)   Wt (!) 365 lb (165.6 kg) Comment: unable to weigh  SpO2 95%   BMI 42.18 kg/m    General: Well-nourished, well-developed in no acute distress.  Eyes: No icterus. Mouth: Oropharyngeal mucosa moist and pink   Abdomen: Bowel sounds are normal, nontender, nondistended, no hepatosplenomegaly or masses,  no abdominal bruits or hernia , no rebound or guarding. Limited by body habitus Rectal: not performed  Extremities: compression stockings bilaterally.. Neuro: Alert and oriented x 4   Skin: Warm and dry, no jaundice.   Psych: Alert and cooperative, normal mood and affect.  Labs   Lab Results  Component Value Date   NA  135 04/23/2023   CL 104 04/23/2023   K 3.8 04/23/2023   CO2 22 04/23/2023   BUN 8 04/23/2023   CREATININE 0.68 04/23/2023   GFRNONAA >60 04/23/2023   CALCIUM 8.4 (L) 04/23/2023   ALBUMIN 3.6 10/12/2018   GLUCOSE 109 (H) 04/23/2023   Lab Results  Component Value Date   ALT 22 10/12/2018   AST 20 10/12/2018   ALKPHOS 69 10/12/2018   BILITOT 0.5 10/12/2018   Lab Results  Component Value Date   WBC 6.8 03/08/2023   HGB 15.2 03/08/2023   HCT 46.9 03/08/2023   MCV 91 03/08/2023   PLT 180 03/08/2023   Lab Results  Component Value Date   INR 1.8 (A) 09/25/2023   INR 3.2 (A) 09/03/2023   INR 4.9 (A) 08/22/2023    Imaging Studies   No results found.  Assessment/Plan:  Chronic bowel change: watery stools with mixed bloating/constipation. Recent trial of Linzess for 3 weeks. Now off Linzess, having daily solid stool. Continue to monitor for now. He has Linzess samples if needed in the future, try 72-142mcg daily. Return ov in four months or call sooner if needed.    Pancreatic lipoma: -stable from 2019 to 2024, initial CT in 2019 was noncontrast, and radiologist advised to consider MRI. 2024 contrast CT with stable findings. Patient not interested in pursuing MRI at this time, would be difficult due to his body habitus. Given stability of findings, this is reasonable to hold off on MRI.      Leanna Battles. Melvyn Neth, MHS, PA-C [facility]

## 2023-11-04 ENCOUNTER — Other Ambulatory Visit: Payer: Self-pay | Admitting: Internal Medicine

## 2023-11-04 NOTE — Telephone Encounter (Signed)
Refill request for warfarin:  Last INR was 1.8 on 09/25/23 INR is due. LOV was 04/19/23  Refill approved.

## 2024-01-13 ENCOUNTER — Encounter: Payer: Self-pay | Admitting: Internal Medicine

## 2024-02-07 ENCOUNTER — Ambulatory Visit (HOSPITAL_COMMUNITY)
Admission: RE | Admit: 2024-02-07 | Discharge: 2024-02-07 | Disposition: A | Discharge status: Home / Self Care | Source: Ambulatory Visit | Attending: Gastroenterology | Admitting: Gastroenterology

## 2024-02-07 ENCOUNTER — Other Ambulatory Visit (HOSPITAL_COMMUNITY)
Admission: RE | Admit: 2024-02-07 | Discharge: 2024-02-07 | Disposition: A | Discharge status: Home / Self Care | Source: Ambulatory Visit | Attending: Gastroenterology | Admitting: Gastroenterology

## 2024-02-07 ENCOUNTER — Ambulatory Visit: Admitting: Gastroenterology

## 2024-02-07 ENCOUNTER — Encounter: Payer: Self-pay | Admitting: Gastroenterology

## 2024-02-07 VITALS — BP 120/75 | HR 76 | Temp 97.7°F | Ht 78.0 in

## 2024-02-07 DIAGNOSIS — R197 Diarrhea, unspecified: Secondary | ICD-10-CM | POA: Diagnosis present

## 2024-02-07 DIAGNOSIS — E559 Vitamin D deficiency, unspecified: Secondary | ICD-10-CM | POA: Insufficient documentation

## 2024-02-07 DIAGNOSIS — R935 Abnormal findings on diagnostic imaging of other abdominal regions, including retroperitoneum: Secondary | ICD-10-CM | POA: Diagnosis not present

## 2024-02-07 DIAGNOSIS — R194 Change in bowel habit: Secondary | ICD-10-CM | POA: Diagnosis not present

## 2024-02-07 DIAGNOSIS — D175 Benign lipomatous neoplasm of intra-abdominal organs: Secondary | ICD-10-CM | POA: Diagnosis not present

## 2024-02-07 DIAGNOSIS — K529 Noninfective gastroenteritis and colitis, unspecified: Secondary | ICD-10-CM | POA: Diagnosis present

## 2024-02-07 LAB — CBC WITH DIFFERENTIAL/PLATELET
Abs Immature Granulocytes: 0.01 10*3/uL (ref 0.00–0.07)
Basophils Absolute: 0.1 10*3/uL (ref 0.0–0.1)
Basophils Relative: 1 %
Eosinophils Absolute: 0.3 10*3/uL (ref 0.0–0.5)
Eosinophils Relative: 4 %
HCT: 48.3 % (ref 39.0–52.0)
Hemoglobin: 15.5 g/dL (ref 13.0–17.0)
Immature Granulocytes: 0 %
Lymphocytes Relative: 35 %
Lymphs Abs: 2.2 10*3/uL (ref 0.7–4.0)
MCH: 30.2 pg (ref 26.0–34.0)
MCHC: 32.1 g/dL (ref 30.0–36.0)
MCV: 94 fL (ref 80.0–100.0)
Monocytes Absolute: 0.5 10*3/uL (ref 0.1–1.0)
Monocytes Relative: 7 %
Neutro Abs: 3.3 10*3/uL (ref 1.7–7.7)
Neutrophils Relative %: 53 %
Platelets: 163 10*3/uL (ref 150–400)
RBC: 5.14 MIL/uL (ref 4.22–5.81)
RDW: 13.1 % (ref 11.5–15.5)
WBC: 6.3 10*3/uL (ref 4.0–10.5)
nRBC: 0 % (ref 0.0–0.2)

## 2024-02-07 LAB — COMPREHENSIVE METABOLIC PANEL
ALT: 22 U/L (ref 0–44)
AST: 24 U/L (ref 15–41)
Albumin: 3.7 g/dL (ref 3.5–5.0)
Alkaline Phosphatase: 62 U/L (ref 38–126)
Anion gap: 11 (ref 5–15)
BUN: 12 mg/dL (ref 8–23)
CO2: 23 mmol/L (ref 22–32)
Calcium: 9.3 mg/dL (ref 8.9–10.3)
Chloride: 105 mmol/L (ref 98–111)
Creatinine, Ser: 0.69 mg/dL (ref 0.61–1.24)
GFR, Estimated: >60 mL/min (ref >60)
Glucose, Bld: 100 mg/dL — ABNORMAL HIGH (ref 70–99)
Potassium: 4.2 mmol/L (ref 3.5–5.1)
Sodium: 139 mmol/L (ref 135–145)
Total Bilirubin: 0.7 mg/dL (ref 0.0–1.2)
Total Protein: 7.4 g/dL (ref 6.5–8.1)

## 2024-02-07 LAB — PROTIME-INR
INR: 1.2 (ref 0.8–1.2)
Prothrombin Time: 15.3 s — ABNORMAL HIGH (ref 11.4–15.2)

## 2024-02-07 LAB — TSH: TSH: 1.472 u[IU]/mL (ref 0.350–4.500)

## 2024-02-07 LAB — VITAMIN D 25 HYDROXY (VIT D DEFICIENCY, FRACTURES): Vit D, 25-Hydroxy: 36.52 ng/mL (ref 30–100)

## 2024-02-07 LAB — T4, FREE: Free T4: 0.92 ng/dL (ref 0.61–1.12)

## 2024-02-07 NOTE — Patient Instructions (Signed)
 Please complete labs and xray today. Please verify your medication list, if you have any medications on the list we need to remove or add please call us and we will make up dates.  Stop Linzess. Plan to be determined after xray results available.

## 2024-02-07 NOTE — Progress Notes (Signed)
 GI Office Note    Referring Provider: Wilmon Pali, FNP Primary Care Physician:  Wilmon Pali, FNP  Primary Gastroenterologist: Roetta Sessions, MD   Chief Complaint   Chief Complaint  Patient presents with   Follow-up    Still having the same issues with his stools    History of Present Illness   Roger Phelps is a 68 y.o. male presenting today for follow-up.  Last seen November 2024.  History of change in bowel habits with diarrhea after starting new medication for osteoporosis in December 2023. Switched to Reclast once yearly.  Workup was negative for GI pathogen panel, C. difficile.  Fecal elastase normal.  Colonoscopy with single adenoma removed, biopsies negative for microscopic colitis.  Due for surveillance in 7 years.  Tried Levsin and Bentyl but caused stomach pain.  CT showed fatty appearing mass around the uncinate process most consistent with stable lipoma (from 2019), document of stool throughout the colon consistent with constipation. He was placed on Linzess for possible overflow diarrhea.   When I last saw him he felt like he was moving his bowels well taking a total of 145 mcg of Linzess, he felt like 2 of the 72's was better than one of the 145s.  He felt like his bowels are moving better but they were all very watery.  He had been come off of Linzess for about 3 weeks, no other constipation medication either, finally having a solid stool was happy with his bowel movements at that time.  Today: Frustrated with his bowel habits.  Mostly having watery stools, some days up to 5 times daily.  When he has multiple stools, sometimes the last couple stools are small-volume.  Other times he feels like he passes a pint of liquid at a time.  Has a lot of gas.  When his stools are watery he often feels more bloated and swollen.  Currently taking Linzess about 3 days weekly.  He has used over-the-counter laxatives when he feels bloated and passing watery stools, during these  times he sometimes will pass more waste with the watery stools.  He denies any blood in the stools.  No recent antibiotics.  We went through his medication list extensively today, he did not bring his medications.  He will double check his list at home let us know if there is any changes we need to make.  He has been on vitamin D 10,000 IU daily for a couple of years now.  He is not sure if he was supposed to stop it stating that his vitamin D always seems to be low.     CT abdomen pelvis with contrast July 2024 IMPRESSION: 1. No acute findings to explain the patient's right lower quadrant pain. Stool in the majority of the colon is indicative of constipation. 2. 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 3. Small to borderline enlarged inguinal lymph nodes, possibly reactive. 4.  Aortic atherosclerosis (ICD10-I70.0).   Colonoscopy 04/2023: -one 5mm polyp in mid sigmoid colon removed, tubular adenoma -redundant and elongated colon -random colon biopsies negative -next colonoscopy 7 years       Medications   Current Outpatient Medications  Medication Sig Dispense Refill   albuterol (VENTOLIN HFA) 108 (90 Base) MCG/ACT inhaler Inhale 2 puffs into the lungs every 6 (six) hours as needed for shortness of breath or wheezing.     ascorbic acid (VITAMIN C) 500 MG tablet Take 500 mg by mouth daily.  b complex vitamins capsule Take 1 capsule by mouth daily.     Cholecalciferol (VITAMIN D-3) 5000 units TABS Take 10,000 Units by mouth daily.     clobetasol (TEMOVATE) 0.05 % external solution      furosemide (LASIX) 40 MG tablet Take 1 tablet (40 mg total) by mouth daily. 90 tablet 1   ondansetron (ZOFRAN) 4 MG tablet Take 4 mg by mouth daily.     potassium chloride SA (KLOR-CON M) 20 MEQ tablet Take 1 tablet (20 mEq total) by mouth daily. 90 tablet 1   vitamin B-12 (CYANOCOBALAMIN) 500 MCG tablet Take 500 mcg by mouth daily.     warfarin (COUMADIN) 10 MG tablet TAKE 1 TO 2  TABLETS EVERY DAY AS DIRECTED BY COUMADIN CLINIC (Patient taking differently: 15 mg. TAKE 1 TO 2 TABLETS EVERY DAY AS DIRECTED BY COUMADIN CLINIC) 180 tablet 1   No current facility-administered medications for this visit.    Allergies   Allergies as of 02/07/2024 - Review Complete 02/07/2024  Allergen Reaction Noted   Codeine Nausea Only 07/28/2018      Review of Systems   General: Negative for anorexia, weight loss, fever, chills, fatigue, weakness. ENT: Negative for hoarseness, difficulty swallowing , nasal congestion. CV: Negative for chest pain, angina, palpitations, dyspnea on exertion, peripheral edema.  Respiratory: Negative for dyspnea at rest, dyspnea on exertion, cough, sputum, wheezing.  GI: See history of present illness. GU:  Negative for dysuria, hematuria, urinary incontinence, urinary frequency, nocturnal urination.  Endo: Negative for unusual weight change.     Physical Exam   BP 120/75 (BP Location: Left Arm, Patient Position: Sitting, Cuff Size: Large)   Pulse 76   Temp 97.7 F (36.5 C) (Oral)   Ht 6\' 6"  (1.981 m)   SpO2 97%   BMI 42.18 kg/m    General: Well-nourished, well-developed in no acute distress.  Eyes: No icterus. Mouth: Oropharyngeal mucosa moist and pink  Abdomen: Bowel sounds are normal, nontender, nondistended, no hepatosplenomegaly or masses,  no abdominal bruits or hernia , no rebound or guarding. Mild lower abdomen tenderness. Exam limited by body habitus and in wheelchair Rectal: not performed  Extremities: compression stockings bilaterally. No clubbing or deformities. Neuro: Alert and oriented x 4   Skin: Warm and dry, no jaundice.   Psych: Alert and cooperative, normal mood and affect.  Labs   Lab Results  Component Value Date   INR 1.8 (A) 09/25/2023   INR 3.2 (A) 09/03/2023   INR 4.9 (A) 08/22/2023   Lab Results  Component Value Date   NA 135 04/23/2023   CL 104 04/23/2023   K 3.8 04/23/2023   CO2 22 04/23/2023   BUN  8 04/23/2023   CREATININE 0.68 04/23/2023   GFRNONAA >60 04/23/2023   CALCIUM 8.4 (L) 04/23/2023   ALBUMIN 3.6 10/12/2018   GLUCOSE 109 (H) 04/23/2023   Lab Results  Component Value Date   WBC 6.8 03/08/2023   HGB 15.2 03/08/2023   HCT 46.9 03/08/2023   MCV 91 03/08/2023   PLT 180 03/08/2023     Imaging Studies   No results found.  Assessment/Plan;   Chronic bowel change: ongoing for over a year with extensive evaluation. Complains of bloating/gas/watery stools, sometimes feels he is constipated but never passes formed BM. Concerns for overflow diarrhea last year with constipation noted on CT. Not clear if that is problem now. Initially will have him hold Linzess as this could be causing her ongoing watery stools, still taking  3 times weekly.  -stop linzess for now -abd xray to evaluate stool load -if no significant stool load, consider further work up or empirically treat for SIBO -check TSH/free T4, CMET, CBC, Vit D, PT/INR ( on coumadin, not clear when his last INR was completed) -verify medication list, call if any changes need to be made to our records      Pancreatic lipoma: -stable from 2019 to 2024, initial CT in 2019 was noncontrast, and radiologist advised to consider MRI. 2024 contrast CT with stable findings. Patient not interested in pursuing MRI at this time, would be difficult due to his body habitus. Given stability of findings, this is reasonable to hold off on MRI.       Roger Phelps. Melvyn Neth, MHS, PA-C [facility]

## 2024-02-10 ENCOUNTER — Telehealth: Payer: Self-pay

## 2024-02-10 NOTE — Telephone Encounter (Signed)
 Pt was made aware and verbalized understanding. Pt states that he takes 15mg  but will take 20mg  today as instructed and will contact his provider in the morning.

## 2024-02-10 NOTE — Addendum Note (Signed)
 Addended by: Zada Finders on: 02/10/2024 10:01 AM   Modules accepted: Orders

## 2024-02-10 NOTE — Telephone Encounter (Signed)
 Noted.   Please let patient know that his labs show low normal vitamin D, normal thyroid labs, no anemia, normal LFTs, normal kidney function.    TAMMY PLEASE LET PATIENT KNOW: His INR is normal at 1.2. It is not in therapeutic range for someone taking coumadin. He needs to reach out to the coumadin clinic for instructions FIRST THING IN MORNING. I'm not sure who he sees or when he was last seen. Today he should take 15mg  of coumadin (if he has been only taken 10mg  daily). If he has been taking 15mg  then he should take 20mg  today. Instructions for coumadin beyond today needs to come from his coumadin clinic including repeat lab for level.  Awaiting xray report.

## 2024-02-10 NOTE — Telephone Encounter (Signed)
 Pt called stating that his medication list was correct except for the vit b 12, he no longer takes that. His medication list has been updated in his chart to reflect this.

## 2024-02-20 ENCOUNTER — Other Ambulatory Visit: Payer: Self-pay | Admitting: *Deleted

## 2024-02-20 DIAGNOSIS — E559 Vitamin D deficiency, unspecified: Secondary | ICD-10-CM

## 2024-02-20 DIAGNOSIS — K529 Noninfective gastroenteritis and colitis, unspecified: Secondary | ICD-10-CM

## 2024-02-20 DIAGNOSIS — R1031 Right lower quadrant pain: Secondary | ICD-10-CM

## 2024-02-21 ENCOUNTER — Ambulatory Visit (HOSPITAL_COMMUNITY)
Admission: RE | Admit: 2024-02-21 | Discharge: 2024-02-21 | Disposition: A | Discharge status: Home / Self Care | Source: Ambulatory Visit | Attending: Gastroenterology | Admitting: Gastroenterology

## 2024-02-21 DIAGNOSIS — K529 Noninfective gastroenteritis and colitis, unspecified: Secondary | ICD-10-CM | POA: Insufficient documentation

## 2024-02-21 DIAGNOSIS — R1031 Right lower quadrant pain: Secondary | ICD-10-CM | POA: Diagnosis present

## 2024-02-21 DIAGNOSIS — E559 Vitamin D deficiency, unspecified: Secondary | ICD-10-CM | POA: Insufficient documentation

## 2024-02-21 MED ORDER — IOHEXOL 9 MG/ML PO SOLN
ORAL | Status: AC
  Filled 2024-02-21: qty 1000

## 2024-02-21 MED ORDER — IOHEXOL 300 MG/ML  SOLN
100.0000 mL | Freq: Once | INTRAMUSCULAR | Status: AC | PRN
Start: 2024-02-21 — End: 2024-02-21
  Administered 2024-02-21: 100 mL via INTRAVENOUS

## 2024-03-05 ENCOUNTER — Telehealth: Payer: Self-pay

## 2024-03-05 NOTE — Telephone Encounter (Signed)
 See result note.

## 2024-03-05 NOTE — Telephone Encounter (Signed)
error 

## 2024-03-05 NOTE — Telephone Encounter (Signed)
 Pt called wanting to know results/recommendations from recent CT.

## 2024-03-09 ENCOUNTER — Telehealth: Payer: Self-pay

## 2024-03-09 NOTE — Telephone Encounter (Signed)
 Pt called wanting the medication for SIBO sent in to [facility] states that his stools are back to being clear again.

## 2024-03-10 MED ORDER — RIFAXIMIN 550 MG PO TABS: 550.0000 mg | ORAL_TABLET | Freq: Three times a day (TID) | ORAL | 0 refills | Status: AC

## 2024-03-10 NOTE — Telephone Encounter (Signed)
 Pt was made aware and verbalized understanding.  Mandy: please arrange f/u with DR Riley Cheadle in six weeks

## 2024-03-10 NOTE — Addendum Note (Signed)
 Addended by: Lanney Pitts on: 03/10/2024 12:40 PM   Modules accepted: Orders

## 2024-03-10 NOTE — Telephone Encounter (Signed)
 I have sent in Xifaxan  tid for 14 days.  He will need to reach out to his provider who monitors his coumadin  as he will likely need to have his INR checked after on Xifaxan  for several days to make sure blood not too thin. Please stress importance of this.    Needs appt with Dr. Riley Cheadle only in six weeks. Please arrange.

## 2024-03-12 MED ORDER — AMOXICILLIN-POT CLAVULANATE 875-125 MG PO TABS: 1.0000 | ORAL_TABLET | Freq: Two times a day (BID) | ORAL | 0 refills | Status: AC

## 2024-03-12 NOTE — Telephone Encounter (Signed)
 Sending in augmentin  875mg  bid for 10 days

## 2024-03-12 NOTE — Addendum Note (Signed)
 Addended by: Lanney Pitts on: 03/12/2024 03:29 PM   Modules accepted: Orders

## 2024-03-12 NOTE — Telephone Encounter (Signed)
 Left message notifying pt.

## 2024-03-12 NOTE — Telephone Encounter (Signed)
 Pt is unable to afford the Xifaxan  after insurance

## 2024-03-25 ENCOUNTER — Other Ambulatory Visit: Payer: Self-pay

## 2024-03-25 ENCOUNTER — Telehealth: Payer: Self-pay

## 2024-03-25 ENCOUNTER — Encounter: Payer: Self-pay | Admitting: Gastroenterology

## 2024-03-25 DIAGNOSIS — K529 Noninfective gastroenteritis and colitis, unspecified: Secondary | ICD-10-CM

## 2024-03-25 NOTE — Telephone Encounter (Signed)
 Pt called stating that the augmentin  is not working that his stool has turned clear again. Pt has a few days left.

## 2024-03-25 NOTE — Telephone Encounter (Signed)
 Have him stop augmentin .  NO laxatives. NO Linzess . To be sure he does not have infection, send stool for GI profile at [facility]. Make first available appt with Dr. Riley Cheadle. Does not need to wait until June. I want him to see Dr. Riley Cheadle because I don't know what else to offer at this time.

## 2024-03-30 ENCOUNTER — Other Ambulatory Visit: Payer: Self-pay | Admitting: Internal Medicine

## 2024-03-30 NOTE — Telephone Encounter (Signed)
 Pt's last anticoagulation visit was 09/25/23, pt is overdue for follow-up.  Needs INR check for Warfarin refill.

## 2024-03-30 NOTE — Telephone Encounter (Signed)
 LMOM that pt is past due for INR appt.  Requested call back to make appt.

## 2024-04-01 NOTE — Telephone Encounter (Signed)
 Called patient again.  Still No answer.  LM for pt to call back to schedule INR appt.  Refill denied.

## 2024-04-16 ENCOUNTER — Ambulatory Visit: Attending: Internal Medicine | Admitting: *Deleted

## 2024-04-16 DIAGNOSIS — I4891 Unspecified atrial fibrillation: Secondary | ICD-10-CM

## 2024-04-16 DIAGNOSIS — Z5181 Encounter for therapeutic drug level monitoring: Secondary | ICD-10-CM

## 2024-04-16 DIAGNOSIS — Z8673 Personal history of transient ischemic attack (TIA), and cerebral infarction without residual deficits: Secondary | ICD-10-CM

## 2024-04-16 LAB — POCT INR: INR: 4.2 — AB (ref 2.0–3.0)

## 2024-04-16 NOTE — Patient Instructions (Signed)
 Hold warfarin tonight then decrease dose to 1 1/2 tablets daily except 1 tablet on Tuesdays and Fridays  Recheck in 2 wks

## 2024-04-29 ENCOUNTER — Ambulatory Visit: Attending: Internal Medicine | Admitting: *Deleted

## 2024-04-29 DIAGNOSIS — I4891 Unspecified atrial fibrillation: Secondary | ICD-10-CM

## 2024-04-29 DIAGNOSIS — Z7901 Long term (current) use of anticoagulants: Secondary | ICD-10-CM | POA: Diagnosis not present

## 2024-04-29 DIAGNOSIS — Z5181 Encounter for therapeutic drug level monitoring: Secondary | ICD-10-CM | POA: Diagnosis not present

## 2024-04-29 DIAGNOSIS — Z8673 Personal history of transient ischemic attack (TIA), and cerebral infarction without residual deficits: Secondary | ICD-10-CM

## 2024-04-29 LAB — POCT INR: INR: 1.4 — AB (ref 2.0–3.0)

## 2024-04-29 NOTE — Patient Instructions (Signed)
Take warfarin 2 tablets tonight then increase dose to 1 1/2 tablets daily. Recheck in 2 wks

## 2024-05-01 ENCOUNTER — Ambulatory Visit (INDEPENDENT_AMBULATORY_CARE_PROVIDER_SITE_OTHER): Admitting: Internal Medicine

## 2024-05-01 VITALS — BP 133/72 | HR 80 | Temp 98.6°F | Ht 79.0 in

## 2024-05-01 DIAGNOSIS — R194 Change in bowel habit: Secondary | ICD-10-CM | POA: Diagnosis not present

## 2024-05-01 DIAGNOSIS — K529 Noninfective gastroenteritis and colitis, unspecified: Secondary | ICD-10-CM

## 2024-05-01 MED ORDER — AMOXICILLIN-POT CLAVULANATE 875-125 MG PO TABS: 1.0000 | ORAL_TABLET | Freq: Two times a day (BID) | ORAL | 11 refills | Status: AC

## 2024-05-01 NOTE — Patient Instructions (Signed)
 It was nice to see you again today!  Since your bowel function has improved with the recent course of antibiotics we will go ahead and give you 5 days worth of antibiotics every month  Specifically, Augmentin  875 mg twice daily x 5 days every 30 days.  New prescription: dispense and with 11 refills.  Office visit with us  here in 3 months

## 2024-05-01 NOTE — Progress Notes (Unsigned)
 Primary Care Physician:  Joanne Muckle, FNP Primary Gastroenterologist:  Dr. Riley Cheadle  Pre-Procedure History & Physical: HPI:  Roger Phelps is a 68 y.o. male plegic wheelchair-bound here for  Diarrhea.  Extensive evaluation including prior stool studies fecal elastase: Negative biopsies for microscopic colitis small adenoma due for surveillance 7 years from now.  10-day course of Augmentin  seems to have helped stooling is much as anything else was prescribed 2 months ago.  States he had his first loose stool today.  Not bleeding.  Appetite is good.  He is not taking any laxatives at this time. Past Medical History:  Diagnosis Date   Arthritis    Atrial fibrillation (HCC)    DVT (deep venous thrombosis) (HCC)    Dyspnea    Dysrhythmia    GERD (gastroesophageal reflux disease)    History of kidney stones    Hypertension    Pancreatic mass    Pulmonary embolism (HCC)    Renal disorder    kidney stones   Stroke [facility])    TIA (transient ischemic attack)    Tobacco use     Past Surgical History:  Procedure Laterality Date   BIOPSY  04/23/2023   Procedure: BIOPSY;  Surgeon: Suzette Espy, MD;  [facility]: [facility];  Service: Endoscopy;;   CATARACT EXTRACTION, BILATERAL     COLONOSCOPY WITH PROPOFOL  N/A 04/23/2023   Procedure: COLONOSCOPY WITH PROPOFOL ;  Surgeon: Suzette Espy, MD;  Location: [facility];  Service: Endoscopy;  Laterality: N/A;  1:00 PM, ASA 3   POLYPECTOMY  04/23/2023   Procedure: POLYPECTOMY;  Surgeon: Suzette Espy, MD;  Location: [facility];  Service: Endoscopy;;   REPLACEMENT TOTAL KNEE Right 2023   URETERAL STENT PLACEMENT Bilateral    VARICOSE VEIN SURGERY Left     Prior to Admission medications   Medication Sig Start Date End Date Taking? Authorizing Provider  albuterol (VENTOLIN HFA) 108 (90 Base) MCG/ACT inhaler Inhale 2 puffs into the lungs every 6 (six) hours as needed for shortness of breath or wheezing. 07/03/19  Yes [provider]   ascorbic acid (VITAMIN C) 500 MG tablet Take 500 mg by mouth daily.   Yes [provider]  b complex vitamins capsule Take 1 capsule by mouth daily.   Yes [provider]  Cholecalciferol (VITAMIN D -3) 5000 units TABS Take 10,000 Units by mouth daily.   Yes [provider]  clobetasol (TEMOVATE) 0.05 % external solution  01/31/24  Yes [provider]  ondansetron  (ZOFRAN ) 4 MG tablet Take 4 mg by mouth daily. 08/25/21  Yes [provider]  warfarin (COUMADIN ) 10 MG tablet TAKE 1 TO 2 TABLETS EVERY DAY AS DIRECTED BY COUMADIN  CLINIC 11/04/23  Yes Mallipeddi, Vishnu P, MD  furosemide  (LASIX ) 40 MG tablet Take 1 tablet (40 mg total) by mouth daily. Patient not taking: Reported on 05/01/2024 04/19/23   Laurann Pollock, MD  potassium chloride  SA (KLOR-CON  M) 20 MEQ tablet Take 1 tablet (20 mEq total) by mouth daily. Patient not taking: Reported on 05/01/2024 04/19/23   Laurann Pollock, MD    Allergies as of 05/01/2024 - Review Complete 05/01/2024  Allergen Reaction Noted   Codeine Nausea Only 07/28/2018    Family History  Problem Relation Age of Onset   Glaucoma Mother    Lung cancer Father    Colon cancer Neg Hx     Social History   Socioeconomic History   Marital status: Divorced  Spouse name: Not on file   Number of children: Not on file   Years of education: Not on file   Highest education level: Not on file  Occupational History   Not on file  Tobacco Use   Smoking status: Every Day    Current packs/day: 0.25    Types: Cigarettes   Smokeless tobacco: Never  Vaping Use   Vaping status: Never Used  Substance and Sexual Activity   Alcohol use: Not Currently    Comment: ocassionally   Drug use: Never   Sexual activity: Not Currently  Other Topics Concern   Not on file  Social History Narrative   Not on file   Social Drivers of Health   Financial Resource Strain: Not on file  Food Insecurity: Low Risk  (01/22/2023)    Received from [facility]   Hunger Vital Sign    Within the past 12 months, you worried that your food would run out before you got money to buy more: Never true    Within the past 12 months, the food you bought just didn't last and you didn't have money to get more. : Never true  Transportation Needs: Unmet Transportation Needs (01/22/2023)   Received from [employer]    In the past 12 months, has lack of reliable transportation kept you from medical appointments, meetings, work or from getting things needed for daily living? : Yes  Physical Activity: Not on file  Stress: Not on file  Social Connections: Not on file  Intimate Partner Violence: Not on file    Review of Systems: See HPI, otherwise negative ROS  Physical Exam: BP 133/72   Pulse 80   Temp 98.6 F (37 C)   Ht 6' 7 (2.007 m)   BMI 41.12 kg/m  General:   Alert, wheelchair-bound.  Pleasant no acute distress.  Accompanied by his wife.   Abdomen: Massively obese.  Soft and nontender  Impression/Plan: 68 year old gentleman paraplegic wheelchair-bound with altered bowel function.  Is difficult to assess clinical status and bowel function with his symptoms.  There seems to be an element of constipation but again significant slant towards diarrhea.  I do not think were dealing with overflow incontinence.  He has had an extensive workup as to the cause of loose stools.  He seems to have improved with a 10-day course of antibiotics recently which increases the likelihood of SIBO.  Recommendations: Augmentin  875 mg twice daily x 5 days every 30 days.  New prescription: dispense and with 11 refills.  Office visit with us  here in 3 months  Notice: This dictation was prepared with Dragon dictation along with smaller phrase technology. Any transcriptional errors that result from this process are unintentional and may not be corrected upon review.

## 2024-05-13 ENCOUNTER — Encounter

## 2024-05-20 ENCOUNTER — Ambulatory Visit: Attending: Internal Medicine

## 2024-07-08 ENCOUNTER — Ambulatory Visit: Attending: Internal Medicine | Admitting: *Deleted

## 2024-07-08 DIAGNOSIS — Z5181 Encounter for therapeutic drug level monitoring: Secondary | ICD-10-CM | POA: Diagnosis not present

## 2024-07-08 DIAGNOSIS — Z8673 Personal history of transient ischemic attack (TIA), and cerebral infarction without residual deficits: Secondary | ICD-10-CM | POA: Diagnosis not present

## 2024-07-08 DIAGNOSIS — I4891 Unspecified atrial fibrillation: Secondary | ICD-10-CM

## 2024-07-08 LAB — POCT INR: INR: 3.3 — AB (ref 2.0–3.0)

## 2024-07-08 NOTE — Patient Instructions (Signed)
Hold warfarin tonight then resume 1 1/2 tablets daily Recheck in 4 wks

## 2024-07-08 NOTE — Progress Notes (Signed)
 INR 3.3; Please see anticoagulation encounter

## 2024-08-05 ENCOUNTER — Ambulatory Visit: Attending: Internal Medicine

## 2024-10-20 ENCOUNTER — Other Ambulatory Visit: Payer: Self-pay

## 2024-10-20 ENCOUNTER — Ambulatory Visit: Admitting: Internal Medicine

## 2024-10-20 VITALS — BP 138/80 | HR 112 | Temp 97.6°F | Ht 78.0 in | Wt 365.0 lb

## 2024-10-20 DIAGNOSIS — Z7901 Long term (current) use of anticoagulants: Secondary | ICD-10-CM

## 2024-10-20 DIAGNOSIS — R1319 Other dysphagia: Secondary | ICD-10-CM

## 2024-10-20 DIAGNOSIS — R197 Diarrhea, unspecified: Secondary | ICD-10-CM

## 2024-10-20 DIAGNOSIS — K8689 Other specified diseases of pancreas: Secondary | ICD-10-CM

## 2024-10-20 DIAGNOSIS — R140 Abdominal distension (gaseous): Secondary | ICD-10-CM | POA: Diagnosis not present

## 2024-10-20 DIAGNOSIS — K219 Gastro-esophageal reflux disease without esophagitis: Secondary | ICD-10-CM | POA: Diagnosis not present

## 2024-10-20 DIAGNOSIS — R1310 Dysphagia, unspecified: Secondary | ICD-10-CM

## 2024-10-20 DIAGNOSIS — K529 Noninfective gastroenteritis and colitis, unspecified: Secondary | ICD-10-CM

## 2024-10-20 MED ORDER — PANTOPRAZOLE SODIUM 40 MG PO TBEC: 40.0000 mg | DELAYED_RELEASE_TABLET | Freq: Every day | ORAL | 11 refills | Status: AC

## 2024-10-20 NOTE — Patient Instructions (Addendum)
 It was good to see you again today  Since he clearly improved as far as bloating and diarrhea is concerned with antibiotics over the short-term you likely have bacterial overgrowth.  I recommend you take Augmentin  875 mg pill twice daily x 5 days every month.  You should have a prescription already at your pharmacy.  For your acid reflux and swallowing issues, we will begin Protonix  40 mg once daily dispense 30 with 11 refills.  Take the Protonix  30 minutes before breakfast daily  Will plan to see you back in 3 months.  At that time, we will discuss pursuing an EGD.

## 2024-10-20 NOTE — Progress Notes (Unsigned)
 Gastroenterology Progress Note    Primary Care Physician:  Gerome Tillman CROME, FNP Primary Gastroenterologist:  Dr. Shaaron  Pre-Procedure History & Physical: HPI:  Roger Phelps is a 68 y.o. male with multiple comorbidities here for follow-up bloating intermittent diarrhea; he is improved with Augmentin  875 twice daily x 5 days on 2 prior occasions.  He was supposed to be taking it every month.  Does not sound like he has been doing that.  He is taking it on 2 occasions and had marked improvement in bloating and diarrhea suggestive of bacterial overgrowth.  He also describes reflux and sometimes difficulty eating greasy things.  Has had symptoms for years.  Has less trouble with other food consistencies.  No prior EGD.  No acid suppression therapy. remains morbidly obese.  Nonambulatory and is chronically anticoagulated with Xarelto.  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 Past Medical History:  Diagnosis Date   Arthritis    Atrial fibrillation (HCC)    DVT (deep venous thrombosis) (HCC)    Dyspnea    Dysrhythmia    GERD (gastroesophageal reflux disease)    History of kidney stones    Hypertension    Pancreatic mass    Pulmonary embolism (HCC)    Renal disorder    kidney stones   Stroke [facility])    TIA (transient ischemic attack)    Tobacco use     Past Surgical History:  Procedure Laterality Date   BIOPSY  04/23/2023   Procedure: BIOPSY;  Surgeon: Shaaron Lamar HERO, MD;  [facility];  Service: Endoscopy;;   CATARACT EXTRACTION, BILATERAL     COLONOSCOPY WITH PROPOFOL  N/A 04/23/2023   Procedure: COLONOSCOPY WITH PROPOFOL ;  Surgeon: Shaaron Lamar HERO, MD;  [facility];  Service: Endoscopy;  Laterality: N/A;  1:00 PM, ASA 3   POLYPECTOMY  04/23/2023   Procedure: POLYPECTOMY;  Surgeon: Shaaron Lamar HERO, MD;  [facility];  Service: Endoscopy;;   REPLACEMENT TOTAL KNEE Right 2023   URETERAL STENT PLACEMENT Bilateral     VARICOSE VEIN SURGERY Left     Prior to Admission medications   Medication Sig Start Date End Date Taking? Authorizing Provider  albuterol (VENTOLIN HFA) 108 (90 Base) MCG/ACT inhaler Inhale 2 puffs into the lungs every 6 (six) hours as needed for shortness of breath or wheezing. 07/03/19  Yes [provider]  b complex vitamins capsule Take 1 capsule by mouth daily.   Yes [provider]  Cholecalciferol (VITAMIN D -3) 5000 units TABS Take 10,000 Units by mouth daily.   Yes [provider]  warfarin (COUMADIN ) 10 MG tablet TAKE 1 TO 2 TABLETS EVERY DAY AS DIRECTED BY COUMADIN  CLINIC 11/04/23  Yes Mallipeddi, Vishnu P, MD  amoxicillin -clavulanate (AUGMENTIN ) 875-125 MG tablet Take 1 tablet by mouth 2 (two) times daily. 5 days every 30 days Patient not taking: Reported on 10/20/2024 05/01/24   Shaaron Lamar HERO, MD  ascorbic acid (VITAMIN C) 500 MG tablet Take 500 mg by mouth daily. Patient not taking: Reported on 10/20/2024    [provider]  clobetasol (TEMOVATE) 0.05 % external solution  01/31/24   [provider]  furosemide  (LASIX ) 40 MG tablet Take 1 tablet (40 mg total) by mouth daily. Patient not taking: Reported on 10/20/2024 04/19/23   Alvan Dorn FALCON, MD  ondansetron  (ZOFRAN ) 4 MG tablet Take 4 mg by mouth daily. Patient not taking: Reported on 10/20/2024 08/25/21  [provider]  potassium chloride  SA (KLOR-CON  M) 20 MEQ tablet Take 1 tablet (20 mEq total) by mouth daily. Patient not taking: Reported on 10/20/2024 04/19/23   Alvan Dorn FALCON, MD    Allergies as of 10/20/2024 - Review Complete 10/20/2024  Allergen Reaction Noted   Codeine Nausea Only 07/28/2018    Family History  Problem Relation Age of Onset   Glaucoma Mother    Lung cancer Father    Colon cancer Neg Hx     Social History   Socioeconomic History   Marital status: Divorced    Spouse name: Not on file   Number of children: Not on file   Years of  education: Not on file   Highest education level: Not on file  Occupational History   Not on file  Tobacco Use   Smoking status: Every Day    Current packs/day: 0.25    Types: Cigarettes   Smokeless tobacco: Never  Vaping Use   Vaping status: Never Used  Substance and Sexual Activity   Alcohol use: Not Currently    Comment: ocassionally   Drug use: Never   Sexual activity: Not Currently  Other Topics Concern   Not on file  Social History Narrative   Not on file   Social Drivers of Health   Financial Resource Strain: Not on file  Food Insecurity: Low Risk  (01/22/2023)   Received from [facility]   Hunger Vital Sign    Within the past 12 months, you worried that your food would run out before you got money to buy more: Never true    Within the past 12 months, the food you bought just didn't last and you didn't have money to get more. : Never true  Transportation Needs: Unmet Transportation Needs (01/22/2023)   Received from [facility]    In the past 12 months, has lack of reliable transportation kept you from medical appointments, meetings, work or from getting things needed for daily living? : Yes  Physical Activity: Not on file  Stress: Not on file  Social Connections: Not on file  Intimate Partner Violence: Not on file    Review of Systems   See HPI, otherwise negative ROS  Physical Exam: BP 138/80   Pulse (!) 112   Temp 97.6 F (36.4 C) (Temporal)   Ht 6' 6 (1.981 m)   Wt (!) 365 lb (165.6 kg)   BMI 42.18 kg/m  General:   Alert, [name] ill, morbidly obese.  Pleasant and cooperative in NAD accompanied by his wife. Skin:  Intact without significant lesions or rashes. Neck:  Supple; no masses or thyromegaly. No significant cervical adenopathy. Lungs:  Clear throughout to auscultation.   No wheezes, crackles, or rhonchi. No acute distress. Heart:  Regular rate and rhythm; no murmurs, clicks, rubs,  or gallops. Abdomen: Non-distended, normal  bowel sounds.  Soft and nontender without appreciable mass or hepatosplenomegaly.    Impression/Plan:   68 year old morbidly gentleman with multiple comorbidities chronically anticoagulated presents with intermittent bloating and diarrhea.  Likely has SIBO based on clinical response to antibiotics.  He has not taken 5 days worth of antibiotics beginning of every month since I last saw him as instructed.  Also, chronic GERD intermittent esophageal dysphagia.  No prior EGD.  Recommendations:  Since he clearly improved as far as bloating and diarrhea is concerned with antibiotics over the short-term you likely have bacterial overgrowth.  I recommend you take Augmentin  875 mg pill  twice daily x 5 days every month.  You should have a prescription already at your pharmacy.  For your acid reflux and swallowing issues, we will begin Protonix  40 mg once daily dispense 30 with 11 refills.  Take the Protonix  30 minutes before breakfast daily  Will plan to see you back in 3 months.  At that time, we will discuss pursuing an EGD.   Notice: This dictation was prepared with Dragon dictation along with smaller phrase technology. Any transcriptional errors that result from this process are unintentional and may not be corrected upon review.

## 2024-11-19 ENCOUNTER — Encounter: Payer: Self-pay | Admitting: Gastroenterology

## 2025-01-18 ENCOUNTER — Ambulatory Visit: Admitting: Gastroenterology
